# Patient Record
Sex: Female | Born: 1995 | Race: White | Hispanic: Yes | Marital: Single | Smoking: Current some day smoker
Health system: Southern US, Community
[De-identification: ages and names within clinical notes are randomized; demographics above are authoritative.]

## PROBLEM LIST (undated history)

## (undated) ENCOUNTER — Inpatient Hospital Stay: Payer: Self-pay

## (undated) DIAGNOSIS — Z8619 Personal history of other infectious and parasitic diseases: Secondary | ICD-10-CM

## (undated) DIAGNOSIS — J45909 Unspecified asthma, uncomplicated: Secondary | ICD-10-CM

## (undated) DIAGNOSIS — Z789 Other specified health status: Secondary | ICD-10-CM

## (undated) HISTORY — PX: NO PAST SURGERIES: SHX2092

## (undated) HISTORY — DX: Personal history of other infectious and parasitic diseases: Z86.19

## (undated) NOTE — *Deleted (*Deleted)
Preventive Care 21-39 Years Old, Female Preventive care refers to visits with your health care provider and lifestyle choices that can promote health and wellness. This includes:  A yearly physical exam. This may also be called an annual well check.  Regular dental visits and eye exams.  Immunizations.  Screening for certain conditions.  Healthy lifestyle choices, such as eating a healthy diet, getting regular exercise, not using drugs or products that contain nicotine and tobacco, and limiting alcohol use. What can I expect for my preventive care visit? Physical exam Your health care provider will check your:  Height and weight. This may be used to calculate body mass index (BMI), which tells if you are at a healthy weight.  Heart rate and blood pressure.  Skin for abnormal spots. Counseling Your health care provider may ask you questions about your:  Alcohol, tobacco, and drug use.  Emotional well-being.  Home and relationship well-being.  Sexual activity.  Eating habits.  Work and work environment.  Method of birth control.  Menstrual cycle.  Pregnancy history. What immunizations do I need?  Influenza (flu) vaccine  This is recommended every year. Tetanus, diphtheria, and pertussis (Tdap) vaccine  You may need a Td booster every 10 years. Varicella (chickenpox) vaccine  You may need this if you have not been vaccinated. Human papillomavirus (HPV) vaccine  If recommended by your health care provider, you may need three doses over 6 months. Measles, mumps, and rubella (MMR) vaccine  You may need at least one dose of MMR. You may also need a second dose. Meningococcal conjugate (MenACWY) vaccine  One dose is recommended if you are age 19-21 years and a first-year college student living in a residence hall, or if you have one of several medical conditions. You may also need additional booster doses. Pneumococcal conjugate (PCV13) vaccine  You may need  this if you have certain conditions and were not previously vaccinated. Pneumococcal polysaccharide (PPSV23) vaccine  You may need one or two doses if you smoke cigarettes or if you have certain conditions. Hepatitis A vaccine  You may need this if you have certain conditions or if you travel or work in places where you may be exposed to hepatitis A. Hepatitis B vaccine  You may need this if you have certain conditions or if you travel or work in places where you may be exposed to hepatitis B. Haemophilus influenzae type b (Hib) vaccine  You may need this if you have certain conditions. You may receive vaccines as individual doses or as more than one vaccine together in one shot (combination vaccines). Talk with your health care provider about the risks and benefits of combination vaccines. What tests do I need?  Blood tests  Lipid and cholesterol levels. These may be checked every 5 years starting at age 20.  Hepatitis C test.  Hepatitis B test. Screening  Diabetes screening. This is done by checking your blood sugar (glucose) after you have not eaten for a while (fasting).  Sexually transmitted disease (STD) testing.  BRCA-related cancer screening. This may be done if you have a family history of breast, ovarian, tubal, or peritoneal cancers.  Pelvic exam and Pap test. This may be done every 3 years starting at age 21. Starting at age 30, this may be done every 5 years if you have a Pap test in combination with an HPV test. Talk with your health care provider about your test results, treatment options, and if necessary, the need for more tests.   Follow these instructions at home: Eating and drinking   Eat a diet that includes fresh fruits and vegetables, whole grains, lean protein, and low-fat dairy.  Take vitamin and mineral supplements as recommended by your health care provider.  Do not drink alcohol if: ? Your health care provider tells you not to drink. ? You are  pregnant, may be pregnant, or are planning to become pregnant.  If you drink alcohol: ? Limit how much you have to 0-1 drink a day. ? Be aware of how much alcohol is in your drink. In the [place] one drink equals one 12 oz bottle of beer (355 mL), one 5 oz glass of wine (148 mL), or one 1 oz glass of hard liquor (44 mL). Lifestyle  Take daily care of your teeth and gums.  Stay active. Exercise for at least 30 minutes on 5 or more days each week.  Do not use any products that contain nicotine or tobacco, such as cigarettes, e-cigarettes, and chewing tobacco. If you need help quitting, ask your health care provider.  If you are sexually active, practice safe sex. Use a condom or other form of birth control (contraception) in order to prevent pregnancy and STIs (sexually transmitted infections). If you plan to become pregnant, see your health care provider for a preconception visit. What's next?  Visit your health care provider once a year for a well check visit.  Ask your health care provider how often you should have your eyes and teeth checked.  Stay up to date on all vaccines. This information is not intended to replace advice given to you by your health care provider. Make sure you discuss any questions you have with your health care provider. Document Revised: 04/18/2018 Document Reviewed: 04/18/2018 Elsevier Patient Education  2020 [employer] Breast Self-Awareness Breast self-awareness is knowing how your breasts look and feel. Doing breast self-awareness is important. It allows you to catch a breast problem early while it is still small and can be treated. All women should do breast self-awareness, including women who have had breast implants. Tell your doctor if you notice a change in your breasts. What you need:  A mirror.  A well-lit room. How to do a breast self-exam A breast self-exam is one way to learn what is normal for your breasts and to check for changes. To do a  breast self-exam: Look for changes  1. Take off all the clothes above your waist. 2. Stand in front of a mirror in a room with good lighting. 3. Put your hands on your hips. 4. Push your hands down. 5. Look at your breasts and nipples in the mirror to see if one breast or nipple looks different from the other. Check to see if: ? The shape of one breast is different. ? The size of one breast is different. ? There are wrinkles, dips, and bumps in one breast and not the other. 6. Look at each breast for changes in the skin, such as: ? Redness. ? Scaly areas. 7. Look for changes in your nipples, such as: ? Liquid around the nipples. ? Bleeding. ? Dimpling. ? Redness. ? A change in where the nipples are. Feel for changes  1. Lie on your back on the floor. 2. Feel each breast. To do this, follow these steps: ? Pick a breast to feel. ? Put the arm closest to that breast above your head. ? Use your other arm to feel the nipple area of your breast. Feel   the area with the pads of your three middle fingers by making small circles with your fingers. For the first circle, press lightly. For the second circle, press harder. For the third circle, press even harder. ? Keep making circles with your fingers at the different pressures as you move down your breast. Stop when you feel your ribs. ? Move your fingers a little toward the center of your body. ? Start making circles with your fingers again, this time going up until you reach your collarbone. ? Keep making up-and-down circles until you reach your armpit. Remember to keep using the three pressures. ? Feel the other breast in the same way. 3. Sit or stand in the tub or shower. 4. With soapy water on your skin, feel each breast the same way you did in step 2 when you were lying on the floor. Write down what you find Writing down what you find can help you remember what to tell your doctor. Write down:  What is normal for each breast.  Any  changes you find in each breast, including: ? The kind of changes you find. ? Whether you have pain. ? Size and location of any lumps.  When you last had your menstrual period. General tips  Check your breasts every month.  If you are breastfeeding, the best time to check your breasts is after you feed your baby or after you use a breast pump.  If you get menstrual periods, the best time to check your breasts is 5-7 days after your menstrual period is over.  With time, you will become comfortable with the self-exam, and you will begin to know if there are changes in your breasts. Contact a doctor if you:  See a change in the shape or size of your breasts or nipples.  See a change in the skin of your breast or nipples, such as red or scaly skin.  Have fluid coming from your nipples that is not normal.  Find a lump or thick area that was not there before.  Have pain in your breasts.  Have any concerns about your breast health. Summary  Breast self-awareness includes looking for changes in your breasts, as well as feeling for changes within your breasts.  Breast self-awareness should be done in front of a mirror in a well-lit room.  You should check your breasts every month. If you get menstrual periods, the best time to check your breasts is 5-7 days after your menstrual period is over.  Let your doctor know of any changes you see in your breasts, including changes in size, changes on the skin, pain or tenderness, or fluid from your nipples that is not normal. This information is not intended to replace advice given to you by your health care provider. Make sure you discuss any questions you have with your health care provider. Document Revised: 03/26/2018 Document Reviewed: 03/26/2018 Elsevier Patient Education  2020 [employer] Inc.  

---
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
## 2015-04-05 ENCOUNTER — Encounter: Payer: Self-pay | Admitting: Emergency Medicine

## 2015-04-05 ENCOUNTER — Emergency Department: Payer: Medicaid Other

## 2015-04-05 ENCOUNTER — Emergency Department
Admission: EM | Admit: 2015-04-05 | Discharge: 2015-04-05 | Disposition: A | Discharge status: Home / Self Care | Payer: Medicaid Other | Attending: Emergency Medicine | Admitting: Emergency Medicine

## 2015-04-05 DIAGNOSIS — O9989 Other specified diseases and conditions complicating pregnancy, childbirth and the puerperium: Secondary | ICD-10-CM | POA: Insufficient documentation

## 2015-04-05 DIAGNOSIS — M546 Pain in thoracic spine: Secondary | ICD-10-CM | POA: Diagnosis not present

## 2015-04-05 DIAGNOSIS — M545 Low back pain: Secondary | ICD-10-CM | POA: Diagnosis not present

## 2015-04-05 DIAGNOSIS — R1030 Lower abdominal pain, unspecified: Secondary | ICD-10-CM

## 2015-04-05 DIAGNOSIS — R109 Unspecified abdominal pain: Secondary | ICD-10-CM | POA: Diagnosis not present

## 2015-04-05 DIAGNOSIS — Z3A15 15 weeks gestation of pregnancy: Secondary | ICD-10-CM | POA: Diagnosis not present

## 2015-04-05 DIAGNOSIS — M549 Dorsalgia, unspecified: Secondary | ICD-10-CM

## 2015-04-05 DIAGNOSIS — O26899 Other specified pregnancy related conditions, unspecified trimester: Secondary | ICD-10-CM

## 2015-04-05 DIAGNOSIS — O99891 Other specified diseases and conditions complicating pregnancy: Secondary | ICD-10-CM

## 2015-04-05 LAB — COMPREHENSIVE METABOLIC PANEL
ALT: 21 U/L (ref 14–54)
AST: 28 U/L (ref 15–41)
Albumin: 4.3 g/dL (ref 3.5–5.0)
Alkaline Phosphatase: 38 U/L (ref 38–126)
Anion gap: 11 (ref 5–15)
BUN: 9 mg/dL (ref 6–20)
CO2: 20 mmol/L — ABNORMAL LOW (ref 22–32)
Calcium: 9.1 mg/dL (ref 8.9–10.3)
Chloride: 102 mmol/L (ref 101–111)
Creatinine, Ser: 0.66 mg/dL (ref 0.44–1.00)
GFR calc Af Amer: >60 mL/min (ref >60)
GFR calc non Af Amer: >60 mL/min (ref >60)
Glucose, Bld: 112 mg/dL — ABNORMAL HIGH (ref 65–99)
Potassium: 3.3 mmol/L — ABNORMAL LOW (ref 3.5–5.1)
Sodium: 133 mmol/L — ABNORMAL LOW (ref 135–145)
Total Bilirubin: 0.5 mg/dL (ref 0.3–1.2)
Total Protein: 7.6 g/dL (ref 6.5–8.1)

## 2015-04-05 LAB — URINALYSIS COMPLETE WITH MICROSCOPIC (ARMC ONLY)
Bacteria, UA: NONE SEEN
Bilirubin Urine: NEGATIVE
Glucose, UA: NEGATIVE mg/dL
Hgb urine dipstick: NEGATIVE
Leukocytes, UA: NEGATIVE
Nitrite: NEGATIVE
Protein, ur: NEGATIVE mg/dL
Specific Gravity, Urine: 1.03 (ref 1.005–1.030)
pH: 5 (ref 5.0–8.0)

## 2015-04-05 LAB — CBC
HCT: 37.9 % (ref 35.0–47.0)
Hemoglobin: 12.9 g/dL (ref 12.0–16.0)
MCH: 27.4 pg (ref 26.0–34.0)
MCHC: 34.1 g/dL (ref 32.0–36.0)
MCV: 80.2 fL (ref 80.0–100.0)
Platelets: 230 10*3/uL (ref 150–440)
RBC: 4.73 MIL/uL (ref 3.80–5.20)
RDW: 13.2 % (ref 11.5–14.5)
WBC: 10.3 10*3/uL (ref 3.6–11.0)

## 2015-04-05 LAB — LIPASE, BLOOD: Lipase: 21 U/L — ABNORMAL LOW (ref 22–51)

## 2015-04-05 NOTE — Discharge Instructions (Signed)
You've been seen in the emergency department today for back pain and abdominal pain during her pregnancy. Your workup as shown largely normal results. Please follow-up with your OB/GYN physician as soon as possible to discuss her symptoms. Please take Tylenol as needed, as written on the box. Return to the emergency department for any personally concerning symptoms.  Abdominal Pain During Pregnancy Abdominal pain is common in pregnancy. Most of the time, it does not cause harm. There are many causes of abdominal pain. Some causes are more serious than others. Some of the causes of abdominal pain in pregnancy are easily diagnosed. Occasionally, the diagnosis takes time to understand. Other times, the cause is not determined. Abdominal pain can be a sign that something is very wrong with the pregnancy, or the pain may have nothing to do with the pregnancy at all. For this reason, always tell your health care provider if you have any abdominal discomfort. HOME CARE INSTRUCTIONS  Monitor your abdominal pain for any changes. The following actions may help to alleviate any discomfort you are experiencing:  Do not have sexual intercourse or put anything in your vagina until your symptoms go away completely.  Get plenty of rest until your pain improves.  Drink clear fluids if you feel nauseous. Avoid solid food as long as you are uncomfortable or nauseous.  Only take over-the-counter or prescription medicine as directed by your health care provider.  Keep all follow-up appointments with your health care provider. SEEK IMMEDIATE MEDICAL CARE IF:  You are bleeding, leaking fluid, or passing tissue from the vagina.  You have increasing pain or cramping.  You have persistent vomiting.  You have painful or bloody urination.  You have a fever.  You notice a decrease in your baby's movements.  You have extreme weakness or feel faint.  You have shortness of breath, with or without abdominal  pain.  You develop a severe headache with abdominal pain.  You have abnormal vaginal discharge with abdominal pain.  You have persistent diarrhea.  You have abdominal pain that continues even after rest, or gets worse. MAKE SURE YOU:   Understand these instructions.  Will watch your condition.  Will get help right away if you are not doing well or get worse. Document Released: 08/07/2005 Document Revised: 05/28/2013 Document Reviewed: 03/06/2013 [facility] Patient Information 2015 [place], [place]. This information is not intended to replace advice given to you by your health care provider. Make sure you discuss any questions you have with your health care provider.

## 2015-04-05 NOTE — ED Notes (Signed)
Pt has low abd pain since yesterday.  Pt states she is approx [redacted] weeks pregnant.  No vag bleeding   No dysuria.  Pt has had recent uti sx before pregnancy.  Pt reports pain with intercourse and with walking.   Pt's first appt with ob is next week with [facility].

## 2015-04-05 NOTE — ED Notes (Addendum)
Pt reports lower back pain for "weeks", reports painful to walk. Pt approx 15w 1d pregnant. Pt also reports lower abdominal cramping x2 weeks. Pt denies vaginal bleeding or discharge, pt and mother reports painful sex.

## 2015-04-05 NOTE — ED Provider Notes (Signed)
[facility] Emergency Department Provider Note  Time seen: 5:43 PM  I have reviewed the triage vital signs and the nursing notes.   HISTORY  Chief Complaint Abdominal Pain and Back Pain    HPI Sherry Malone is a 19 y.o. female who presents the emergency department with lower abdominal pain and lower back pain for the past 2 weeks. According to the patient she is currently [redacted] weeks pregnant by her last period, has not had an ultrasound, complaining of lower abdominal and lower back pain for the past 2 weeks. Patient states intermittent, but worse with walking, worse with sex, denies dysuria, nausea, vomiting, diarrhea. Denies vaginal bleeding, discharge, fluid. Describes her pain as moderate, dull/aching.    History reviewed. No pertinent past medical history.  There are no active problems to display for this patient.   History reviewed. No pertinent past surgical history.  No current outpatient prescriptions on file.  Allergies Review of patient's allergies indicates no known allergies.  No family history on file.  Social History Social History  Substance Use Topics  . Smoking status: Never Smoker   . Smokeless tobacco: None  . Alcohol Use: No    Review of Systems Constitutional: Negative for fever. Cardiovascular: Negative for chest pain. Respiratory: Negative for shortness of breath. Gastrointestinal: Positive for lower abdominal pain. Genitourinary: Negative for dysuria. Musculoskeletal: Positive for lower back pain. 10-point ROS otherwise negative.  ____________________________________________   PHYSICAL EXAM:  VITAL SIGNS: ED Triage Vitals  Enc Vitals Group     BP 04/05/15 1639 113/66 mmHg     Pulse Rate 04/05/15 1639 104     Resp 04/05/15 1639 16     Temp 04/05/15 1639 98.1 F (36.7 C)     Temp Source 04/05/15 1639 Oral     SpO2 04/05/15 1639 99 %     Weight 04/05/15 1639 156 lb (70.761 kg)     Height 04/05/15 1639  5\' 1"  (1.549 m)     Head Cir --      Peak Flow --      Pain Score 04/05/15 1640 10     Pain Loc --      Pain Edu? --      Excl. in GC? --     Constitutional: Alert and oriented. Well appearing and in no distress. Eyes: Normal exam ENT   Head: Normocephalic and atraumatic. Cardiovascular: Normal rate, regular rhythm. No murmurs, rubs, or gallops. Respiratory: Normal respiratory effort without tachypnea nor retractions. Breath sounds are clear and equal bilaterally. No wheezes/rales/rhonchi. Gastrointestinal: Soft and nontender. No distention.  Mild bilateral CVA tenderness palpation. Musculoskeletal: Nontender with normal range of motion in all extremities. No lower extremity tenderness or edema. Mild thoracic and lumbar paraspinal tenderness palpation. No midline tenderness. Neurologic:  Normal speech and language. No gross focal neurologic deficits are appreciated. Speech is normal. Skin:  Skin is warm, dry and intact.  Psychiatric: Mood and affect are normal. Speech and behavior are normal. Patient exhibits appropriate insight and judgment.  ____________________________________________   RADIOLOGY  Ultrasound within normal limits for 11 week 2 day fetus.  ____________________________________________   INITIAL IMPRESSION / ASSESSMENT AND PLAN / ED COURSE  Pertinent labs & imaging results that were available during my care of the patient were reviewed by me and considered in my medical decision making (see chart for details).  Patient with 2 weeks of intermittent lower back pain and lower abdominal pain, worse with ambulation/movement. Patient has not yet seen an  OB ((appointment is next week). Overall patient appears very well. Mild back tenderness on exam, no abdominal tenderness elicited. No rebound or guarding. No distention. We will check labs, ultrasound, and monitor in the emergency department.  Labs are largely within normal limits. Ultrasound within normal limits.  We'll discharge patient home with Tylenol as needed, an OB/GYN follow-up.  ____________________________________________   FINAL CLINICAL IMPRESSION(S) / ED DIAGNOSES  Lower abdominal pain Back pain   Minna Antis, MD 04/05/15 2005

## 2015-06-10 ENCOUNTER — Ambulatory Visit: Payer: Medicaid Other

## 2015-06-10 ENCOUNTER — Ambulatory Visit
Admission: RE | Admit: 2015-06-10 | Discharge: 2015-06-10 | Disposition: A | Discharge status: Home / Self Care | Payer: Medicaid Other | Source: Ambulatory Visit | Attending: Maternal & Fetal Medicine | Admitting: Maternal & Fetal Medicine

## 2015-06-10 VITALS — BP 114/60 | HR 95 | Temp 98.2°F | Resp 18 | Ht 62.4 in | Wt 152.8 lb

## 2015-06-10 DIAGNOSIS — Z83518 Family history of other specified eye disorder: Secondary | ICD-10-CM | POA: Insufficient documentation

## 2015-06-10 DIAGNOSIS — IMO0002 Reserved for concepts with insufficient information to code with codable children: Secondary | ICD-10-CM | POA: Insufficient documentation

## 2015-06-10 DIAGNOSIS — O351XX0 Maternal care for (suspected) chromosomal abnormality in fetus, not applicable or unspecified: Secondary | ICD-10-CM | POA: Diagnosis not present

## 2015-06-10 DIAGNOSIS — Z31438 Encounter for other genetic testing of female for procreative management: Secondary | ICD-10-CM | POA: Insufficient documentation

## 2015-06-10 HISTORY — DX: Other specified health status: Z78.9

## 2015-06-10 NOTE — Progress Notes (Signed)
Genetic counseling recommendations and assessment reviewed with CGC Wells, I agree with her consultation as outlined.  

## 2015-06-10 NOTE — Progress Notes (Addendum)
Referring physician:  [facility] 40 minute consultation  [name] was seen for genetic counseling at [facility] [place] on 06/09/2105 to discuss the finding on increased nuchal fold on prior ultrasound.  She was seen today for counseling and lab studies, but will return to this clinic on Thursday, 06/17/2015 for a detailed anatomy ultrasound, as that was not initially ordered for today.    We reviewed the finding of the nuchal fold measuring 6.14 on ultrasound at [facility] at 19 weeks.  The remainder of the anatomy was reported to be normal.  We do recommend a detailed anatomy ultrasound in our clinic for fully assess the risks in this pregnancy.  In general, we discussed that an increased nuchal fold, or nuchal thickening, is sometimes seen in babies with chromosome problems, such as Down syndrome.  It can also be seen in babies without chromosome problems as a normal variation.  Because this finding increased our concern about chromosome conditions, we talked about the options for looking more closely at this pregnancy.  Before nuchal thickening was seen on the ultrasound, [name] had a 1 in >1,000 chance of having a baby with Down syndrome based on her age.  Now that this has been seen, the risk is increased.  There is some debate as to how much nuchal thickening increases the chance for a chromosome problem, particularly when there are no other birth defects seen on ultrasound.  The chance for Down syndrome may be between 7 and 30%.  Regardless of the risk estimate, the most likely chance is that the baby does not have Down syndrome.  We reviewed that Down syndrome is a condition caused by an extra chromosome.  Chromosomes are the structures inside each of the cells of our body which contain the genetic information that directs the growth and development of our bodies.  Each child inherits 23 chromosomes from the mother and 33 from the father.  If an  extra chromosome is passed on, then the baby will have health concerns, birth defects and learning differences as a result.  If the extra chromosome is the number 21 chromosome, then this results in features consistent with Down syndrome including characteristic facial differences, developmental delays and certain birth defects.  At the time of the visit, we reviewed the following testing and screening options for this pregnancy:  Circulating cell free fetal DNA testing (also called the Harmony test) may be used to determine whether or not the baby may have either Down syndrome, trisomy 80, or trisomy 44. This test utilizes a maternal blood sample and DNA sequencing technology to isolate circulating cell free fetal DNA from maternal plasma. The fetal DNA can then be analyzed for DNA sequences that are derived from the three most common chromosomes involved in aneuploidy, chromosomes 13, 18, and 21. If the overall amount of DNA is greater than the expected level for any of these chromosomes, aneuploidy is suspected. This testing is commercially available, and is able to provide another means of determining the chance for one of these common chromosome conditions, without requiring an invasive procedure and traditional karyotype analysis. We discussed this option in detail. We explained that while we do not consider it a replacement for invasive testing and karyotype analysis, a negative result from this testing would be reassuring, though not a guarantee of a normal chromosome complement for the baby. An abnormal result is certainly suggestive of an abnormal chromosome complement, though we would still recommend CVS  or amniocentesis to confirm any findings from this testing.   Targeted ultrasound uses high frequency sound waves to create an image of the developing fetus.  An ultrasound is often recommended as a routine means of evaluating the pregnancy.  It is also used to screen for fetal anatomy problems (for  example, a heart defect) that might be suggestive of a chromosomal or other abnormality.   Amniocentesis involves the removal of a small amount of amniotic fluid from the sac surrounding the fetus with the use of a thin needle inserted through the maternal abdomen and uterus.  Ultrasound guidance is used throughout the procedure.  Fetal cells from amniotic fluid are directly evaluated and > 99.5% of chromosome problems and > 98% of open neural tube defects can be detected. This procedure is generally performed after the 15th week of pregnancy.  The main risks to this procedure include complications leading to miscarriage in less than 1 in 200 cases (0.5%).  At this gestational age, amniocentesis would be performed on labor and delivery at [facility].  Fetal Echocardiogram is a specialized ultrasound of the fetal heart, typically performed after [redacted] weeks gestation.  This option could be considered because babies with a chromosome condition are known to have a increased chance for having a structural heart defect.  Fetal echocardiograms are performed in pediatric cardiology and can be scheduled by your doctor.  After consideration of these options, [name] elected to proceed with InformaSeq testing and MSAFP only today and scheduled her detailed anatomy ultrasound for Thursday, October 27 at 3pm at [facility] [place].  We also inquired about the family and pregnancy history.  [name] reported that her mother has been diagnosed with retinitis pigmentosa (RP).  RP is the term used to describe a group of inherited eye disorders.  These conditions result in night blindness with slowly progressing loss of central vision.  There are mulitple genes that are known to cause RP and can be inherited in various ways, including recessive, dominant and X-linked forms. We encouraged the patient to find out more information from her mother so that we can dicsus this  further next week.  In addition, she reported two cousins with health concerns - one with a heart defect and one with heart problems as well as physical impairments resulting in being in a wheelchair.  The father of the baby, Madelaine Bhat, has scoliosis and a cousin with muscle problems in his legs.  We reviewed that there may be many causes for muscle weakness, including some which are inherited, but that without additional information about the diagnosis it is difficult to assess the chances for other family members to have a similar condition.  Scoliosis is likely the result of a combination of genetic and other factors.  Children of persons with scoliosis are thought to have approximately a 5% chance for also having the condition.  Congenital heart defects are among the most common birth defects, occuring in approximately 1 in 200 livebirths.  They may occur as an isolated condition or in combination with other differences in numerous genetic syndromes.  In the absence of a known genetic syndrome, most congenital heart defects are multifactorial, or caused by a combination of genetic and environmental factors.  Given the distance of this relative to the current pregnancy (a 4th degree relative), the chance for a heart defect in this pregnancy is likely not significantly increase above the population risk.  However, if more information is learned  or a specific genetic syndrome is identified as the cause, we are happy to discuss this information further.  There is no other family history of birth defects, mental retardation or known genetic conditions.  There were also no exposures or complications in the pregnancy that are expected to increase the risk for birth defects in this pregnancy.  The patient was encouraged to contact us with any questions.  We may be reached at [phone].   Cherly Andersoneborah F. Diondra Pines, MS, CGC

## 2015-06-14 LAB — INFORMASEQ(SM) WITH XY ANALYSIS
Fetal Fraction (%):: 14.4
Fetal Number: 1
Gestational Age at Collection: 20.7 weeks
Weight: 152 [lb_av]

## 2015-06-17 ENCOUNTER — Ambulatory Visit
Admission: RE | Admit: 2015-06-17 | Discharge: 2015-06-17 | Disposition: A | Discharge status: Home / Self Care | Payer: Medicaid Other | Source: Ambulatory Visit | Attending: Maternal & Fetal Medicine | Admitting: Maternal & Fetal Medicine

## 2015-06-17 ENCOUNTER — Other Ambulatory Visit: Payer: Self-pay

## 2015-06-17 DIAGNOSIS — O359XX0 Maternal care for (suspected) fetal abnormality and damage, unspecified, not applicable or unspecified: Secondary | ICD-10-CM | POA: Diagnosis present

## 2015-06-17 DIAGNOSIS — Z3A21 21 weeks gestation of pregnancy: Secondary | ICD-10-CM | POA: Diagnosis not present

## 2015-06-17 LAB — MISC LABCORP TEST (SEND OUT): Labcorp test code: 10801

## 2015-06-17 NOTE — Progress Notes (Signed)
While here for her anatomy ultrasound today, the patient was informed of the results of her recent InformaSeq testing (performed at [facility]) which yielded NEGATIVE results.  The patient's specimen showed DNA consistent with two copies of chromosomes 21, 18 and 13.  The sensitivity for trisomy 7621, trisomy 7218 and trisomy 5213 using this testing are reported as 99.1%, 98.3% and 98.1% respectively.  Thus, while the results of this testing are highly accurate, they are not considered diagnostic at this time.  Should more definitive information be desired, the patient may still consider amniocentesis.   As requested to know by the patient, sex chromosome analysis was included for this sample.  Results was consistent with a female fetus. This is predicted with >97% accuracy.  A maternal serum AFP only was also ordered at her previous visit to assess for neural tube defects.  These results were also within normal limits.

## 2015-07-30 LAB — OB RESULTS CONSOLE RPR: RPR: NONREACTIVE

## 2015-07-30 LAB — OB RESULTS CONSOLE PLATELET COUNT: Platelets: 204 10*3/uL

## 2015-07-30 LAB — OB RESULTS CONSOLE HGB/HCT, BLOOD
HCT: 34 %
Hemoglobin: 11.4 g/dL

## 2015-07-30 LAB — OB RESULTS CONSOLE GC/CHLAMYDIA
Chlamydia: NEGATIVE
Gonorrhea: NEGATIVE

## 2015-07-30 LAB — OB RESULTS CONSOLE GBS: GBS: POSITIVE

## 2015-07-30 LAB — OB RESULTS CONSOLE HIV ANTIBODY (ROUTINE TESTING): HIV: NONREACTIVE

## 2015-08-22 NOTE — L&D Delivery Note (Addendum)
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  Delivered By:   Cornelia Copaharlie Wendie Diskin, Jr MD  Delivery Type:   forceps, low  Delivery Details:   Went to see patient due to prolonged and decel decelerations with pushing. Fetus LOA, +3, minimal caput and adequate pelvis. Peds present, anesthesia re-bolused epidural and bladder recently drained. Luikart Elliott's applied w/o difficult and with one contraction and push, fetus delivered easily; forceps removed prior to complete delivery. Delayed cord clamping and cut by father of the baby.  Anesthesia:    epidural Intrapartum complications: gestational HTN GBS:    Positive and s/p of at least two doses of PCN Laceration:    1st degree right vaginal sulcal (not deep, approximately 4cm and perineal divet at introitus). Perineum intact. Repaired with 2-0 vicryl. Rectal exam confirmatory Episiotomy:    none Placenta:    Delivered and expressed via active management. Intact: yes. To pathology: no.  Cord was short Estimated Blood Loss:  300mL  Baby:    Liveborn female, APGARs 10/9, weight 2840gm  Cornelia Copaharlie Amias Hutchinson, Jr. MD [facility] Pager [phone]

## 2015-10-25 ENCOUNTER — Inpatient Hospital Stay
Admission: EM | Admit: 2015-10-25 | Discharge: 2015-10-28 | DRG: 775 | Disposition: A | Discharge status: Home / Self Care | Payer: Medicaid Other | Attending: Obstetrics and Gynecology | Admitting: Obstetrics and Gynecology

## 2015-10-25 DIAGNOSIS — O480 Post-term pregnancy: Secondary | ICD-10-CM | POA: Diagnosis present

## 2015-10-25 DIAGNOSIS — J45909 Unspecified asthma, uncomplicated: Secondary | ICD-10-CM | POA: Diagnosis present

## 2015-10-25 DIAGNOSIS — Z23 Encounter for immunization: Secondary | ICD-10-CM | POA: Diagnosis not present

## 2015-10-25 DIAGNOSIS — Z87891 Personal history of nicotine dependence: Secondary | ICD-10-CM

## 2015-10-25 DIAGNOSIS — G8929 Other chronic pain: Secondary | ICD-10-CM | POA: Diagnosis present

## 2015-10-25 DIAGNOSIS — O134 Gestational [pregnancy-induced] hypertension without significant proteinuria, complicating childbirth: Secondary | ICD-10-CM | POA: Diagnosis present

## 2015-10-25 DIAGNOSIS — O9081 Anemia of the puerperium: Secondary | ICD-10-CM | POA: Diagnosis not present

## 2015-10-25 DIAGNOSIS — Z3A40 40 weeks gestation of pregnancy: Secondary | ICD-10-CM

## 2015-10-25 DIAGNOSIS — O9952 Diseases of the respiratory system complicating childbirth: Secondary | ICD-10-CM | POA: Diagnosis present

## 2015-10-25 DIAGNOSIS — M545 Low back pain: Secondary | ICD-10-CM | POA: Diagnosis present

## 2015-10-25 DIAGNOSIS — O139 Gestational [pregnancy-induced] hypertension without significant proteinuria, unspecified trimester: Secondary | ICD-10-CM | POA: Diagnosis present

## 2015-10-25 DIAGNOSIS — D649 Anemia, unspecified: Secondary | ICD-10-CM | POA: Diagnosis not present

## 2015-10-25 DIAGNOSIS — O133 Gestational [pregnancy-induced] hypertension without significant proteinuria, third trimester: Secondary | ICD-10-CM | POA: Diagnosis present

## 2015-10-25 DIAGNOSIS — O99824 Streptococcus B carrier state complicating childbirth: Secondary | ICD-10-CM | POA: Diagnosis present

## 2015-10-25 DIAGNOSIS — O0933 Supervision of pregnancy with insufficient antenatal care, third trimester: Secondary | ICD-10-CM | POA: Diagnosis not present

## 2015-10-25 HISTORY — DX: Unspecified asthma, uncomplicated: J45.909

## 2015-10-25 LAB — CBC
HCT: 32.7 % — ABNORMAL LOW (ref 35.0–47.0)
Hemoglobin: 11.3 g/dL — ABNORMAL LOW (ref 12.0–16.0)
MCH: 27.8 pg (ref 26.0–34.0)
MCHC: 34.5 g/dL (ref 32.0–36.0)
MCV: 80.6 fL (ref 80.0–100.0)
Platelets: 154 10*3/uL (ref 150–440)
RBC: 4.06 MIL/uL (ref 3.80–5.20)
RDW: 13.8 % (ref 11.5–14.5)
WBC: 7.7 10*3/uL (ref 3.6–11.0)

## 2015-10-25 LAB — COMPREHENSIVE METABOLIC PANEL
ALT: 7 U/L — ABNORMAL LOW (ref 14–54)
AST: 16 U/L (ref 15–41)
Albumin: 3.2 g/dL — ABNORMAL LOW (ref 3.5–5.0)
Alkaline Phosphatase: 184 U/L — ABNORMAL HIGH (ref 38–126)
Anion gap: 9 (ref 5–15)
BUN: 9 mg/dL (ref 6–20)
CO2: 20 mmol/L — ABNORMAL LOW (ref 22–32)
Calcium: 8.6 mg/dL — ABNORMAL LOW (ref 8.9–10.3)
Chloride: 109 mmol/L (ref 101–111)
Creatinine, Ser: 0.62 mg/dL (ref 0.44–1.00)
GFR calc Af Amer: >60 mL/min (ref >60)
GFR calc non Af Amer: >60 mL/min (ref >60)
Glucose, Bld: 101 mg/dL — ABNORMAL HIGH (ref 65–99)
Potassium: 3.9 mmol/L (ref 3.5–5.1)
Sodium: 138 mmol/L (ref 135–145)
Total Bilirubin: 0.5 mg/dL (ref 0.3–1.2)
Total Protein: 6.5 g/dL (ref 6.5–8.1)

## 2015-10-25 LAB — ABO/RH: ABO/RH(D): A POS

## 2015-10-25 LAB — LACTATE DEHYDROGENASE: LDH: 134 U/L (ref 98–192)

## 2015-10-25 LAB — TYPE AND SCREEN
ABO/RH(D): A POS
Antibody Screen: NEGATIVE

## 2015-10-25 LAB — PROTEIN / CREATININE RATIO, URINE
Creatinine, Urine: 150 mg/dL
Protein Creatinine Ratio: 0.13 mg/mg{Cre} (ref 0.00–0.15)
Total Protein, Urine: 19 mg/dL

## 2015-10-25 LAB — URIC ACID: Uric Acid, Serum: 4.1 mg/dL (ref 2.3–6.6)

## 2015-10-25 MED ORDER — OXYTOCIN 40 UNITS IN LACTATED RINGERS INFUSION - SIMPLE MED
2.5000 [IU]/h | INTRAVENOUS | Status: DC
  Administered 2015-10-26: 2.5 [IU]/h via INTRAVENOUS
  Filled 2015-10-25: qty 1000

## 2015-10-25 MED ORDER — CITRIC ACID-SODIUM CITRATE 334-500 MG/5ML PO SOLN: 30.0000 mL | ORAL | Status: DC | PRN

## 2015-10-25 MED ORDER — PENICILLIN G POTASSIUM 5000000 UNITS IJ SOLR
5.0000 10*6.[IU] | Freq: Once | INTRAVENOUS | Status: AC
  Administered 2015-10-25: 5 10*6.[IU] via INTRAVENOUS
  Filled 2015-10-25: qty 5

## 2015-10-25 MED ORDER — ZOLPIDEM TARTRATE 5 MG PO TABS
ORAL_TABLET | ORAL | Status: AC
  Administered 2015-10-25: 5 mg via ORAL
  Filled 2015-10-25: qty 1

## 2015-10-25 MED ORDER — TERBUTALINE SULFATE 1 MG/ML IJ SOLN: 0.2500 mg | Freq: Once | INTRAMUSCULAR | Status: DC | PRN

## 2015-10-25 MED ORDER — DEXTROSE 5 % IV SOLN
2.5000 10*6.[IU] | INTRAVENOUS | Status: DC
  Administered 2015-10-26 (×4): 2.5 10*6.[IU] via INTRAVENOUS
  Filled 2015-10-25 (×11): qty 2.5

## 2015-10-25 MED ORDER — LACTATED RINGERS IV SOLN: 500.0000 mL | INTRAVENOUS | Status: DC | PRN

## 2015-10-25 MED ORDER — LACTATED RINGERS IV SOLN
INTRAVENOUS | Status: DC
  Administered 2015-10-25: 20:00:00 via INTRAVENOUS

## 2015-10-25 MED ORDER — ZOLPIDEM TARTRATE 5 MG PO TABS
5.0000 mg | ORAL_TABLET | Freq: Every evening | ORAL | Status: DC | PRN
  Administered 2015-10-25: 5 mg via ORAL

## 2015-10-25 MED ORDER — OXYTOCIN BOLUS FROM INFUSION
500.0000 mL | INTRAVENOUS | Status: DC
  Administered 2015-10-26: 500 mL via INTRAVENOUS

## 2015-10-25 MED ORDER — LIDOCAINE HCL (PF) 1 % IJ SOLN: 30.0000 mL | INTRAMUSCULAR | Status: DC | PRN

## 2015-10-25 MED ORDER — DINOPROSTONE 10 MG VA INST
10.0000 mg | VAGINAL_INSERT | Freq: Once | VAGINAL | Status: AC
  Administered 2015-10-25: 10 mg via VAGINAL
  Filled 2015-10-25: qty 1

## 2015-10-25 MED ORDER — ONDANSETRON HCL 4 MG/2ML IJ SOLN
4.0000 mg | Freq: Four times a day (QID) | INTRAMUSCULAR | Status: DC | PRN
Start: 2015-10-25 — End: 2015-10-26
  Administered 2015-10-26: 4 mg via INTRAVENOUS
  Filled 2015-10-25: qty 2

## 2015-10-25 MED ORDER — ACETAMINOPHEN 325 MG PO TABS: 650.0000 mg | ORAL_TABLET | ORAL | Status: DC | PRN

## 2015-10-25 NOTE — H&P (Signed)
Obstetric H&P   Chief Complaint: Elevated blood pressure  Prenatal Care Provider: [facility]  History of Present Illness: 20 y.o. G1P0000 [redacted]w[redacted]d by 10/23/2015, by Ultrasound presenting to L&D for elevated BP readings in clinic.  No HA, vision changes, RUQ or epigastric pain, increased edema.  +FM, no LOF, no VB, no ctx.  PNC noteable for late entry to care, THC+ UDS and EtOH use in 1st trimester.  Increased nuchal translucency at 6mm with normal informaseq and anatomy scan.    ABO, Rh: A pos Antibody:  negative Rubella: None Immune Varicella: Immune RPR: Nonreactive (12/09 0741)  HBsAg: Negative HIV: Non-reactive (12/09 0741)  RPR: Nonreactive (12/09 0741) 1-hr: 123 GBS: Positive (12/09 0741)   TDAP 08/15/16 Review of Systems: 10 point review of systems negative unless otherwise noted in HPI  Past Medical History: Past Medical History  Diagnosis Date  . Medical history non-contributory   . Asthma     Past Surgical History: Past Surgical History  Procedure Laterality Date  . No past surgeries      Past Obstetric History:  Past Gynecologic History:  Family History: History reviewed. No pertinent family history.  Social History: Social History   Social History  . Marital Status: Single    Spouse Name: N/A  . Number of Children: N/A  . Years of Education: N/A   Occupational History  . Not on file.   Social History Main Topics  . Smoking status: Former Smoker -- 0.25 packs/day    Quit date: 03/05/2015  . Smokeless tobacco: Never Used  . Alcohol Use: No     Comment: Last use July 2016  . Drug Use: 9.00 per week    Special: Marijuana, Other-see comments  . Sexual Activity: Yes   Other Topics Concern  . Not on file   Social History Narrative    Medications: Prior to Admission medications   Medication Sig Start Date End Date Taking? Authorizing Provider  Prenatal Vit-Fe Fumarate-FA (PRENATAL MULTIVITAMIN) TABS tablet Take 1 tablet by mouth daily at 12 noon.     Historical Provider, MD    Allergies: No Known Allergies  Physical Exam: Vitals: Blood pressure 151/94, pulse 100, temperature 98.1 F (36.7 C), temperature source Oral, resp. rate 18, height  (1.575 m), last menstrual period 12/20/2014.  FHT: 150, moderate, positive accels, no decels Toco: none  General: NAD HEENT: normocephalic, anicteric Pulmonary: CTAB Cardiovascular: RRR Abdomen: Gravid,  Non-tender Leopolds: vtx Genitourinary: pending Extremities: DTR 2+, no edema  Labs: Results for orders placed or performed during the hospital encounter of 10/25/15 (from the past 24 hour(s))  Protein / creatinine ratio, urine     Status: None   Collection Time: 10/25/15  4:31 PM  Result Value Ref Range   Creatinine, Urine 150 mg/dL   Total Protein, Urine 19 mg/dL   Protein Creatinine Ratio 0.13 0.00 - 0.15 mg/mg[Cre]  CBC     Status: Abnormal   Collection Time: 10/25/15  5:52 PM  Result Value Ref Range   WBC 7.7 3.6 - 11.0 K/uL   RBC 4.06 3.80 - 5.20 MIL/uL   Hemoglobin 11.3 (L) 12.0 - 16.0 g/dL   HCT 96.0 (L) 45.4 - 09.8 %   MCV 80.6 80.0 - 100.0 fL   MCH 27.8 26.0 - 34.0 pg   MCHC 34.5 32.0 - 36.0 g/dL   RDW 11.9 14.7 - 82.9 %   Platelets 154 150 - 440 K/uL  Comprehensive metabolic panel     Status: Abnormal   Collection  Time: 10/25/15  5:52 PM  Result Value Ref Range   Sodium 138 135 - 145 mmol/L   Potassium 3.9 3.5 - 5.1 mmol/L   Chloride 109 101 - 111 mmol/L   CO2 20 (L) 22 - 32 mmol/L   Glucose, Bld 101 (H) 65 - 99 mg/dL   BUN 9 6 - 20 mg/dL   Creatinine, Ser 0.980.62 0.44 - 1.00 mg/dL   Calcium 8.6 (L) 8.9 - 10.3 mg/dL   Total Protein 6.5 6.5 - 8.1 g/dL   Albumin 3.2 (L) 3.5 - 5.0 g/dL   AST 16 15 - 41 U/L   ALT 7 (L) 14 - 54 U/L   Alkaline Phosphatase 184 (H) 38 - 126 U/L   Total Bilirubin 0.5 0.3 - 1.2 mg/dL   GFR calc non Af Amer >60 >60 mL/min   GFR calc Af Amer >60 >60 mL/min   Anion gap 9 5 - 15  Uric acid     Status: None   Collection Time:  10/25/15  5:52 PM  Result Value Ref Range   Uric Acid, Serum 4.1 2.3 - 6.6 mg/dL  Lactate dehydrogenase     Status: None   Collection Time: 10/25/15  5:52 PM  Result Value Ref Range   LDH 134 98 - 192 U/L  Type and screen [facility]     Status: None (Preliminary result)   Collection Time: 10/25/15  5:52 PM  Result Value Ref Range   ABO/RH(D) PENDING    Antibody Screen PENDING    Sample Expiration 10/28/2015     Assessment: 20 y.o. G1P0000 2380w2d by 10/23/2015, with GHTN  Plan: 1) GHTN - negative preeclampsia labs, at term keep for induction - cervidil  2) Fetus - cat I tracing  3) TDAP - up to date  4) Disposition - pending delivery

## 2015-10-26 ENCOUNTER — Inpatient Hospital Stay: Payer: Medicaid Other | Admitting: Anesthesiology

## 2015-10-26 ENCOUNTER — Encounter: Payer: Self-pay | Admitting: Obstetrics and Gynecology

## 2015-10-26 LAB — URINE DRUG SCREEN, QUALITATIVE (ARMC ONLY)
Amphetamines, Ur Screen: NOT DETECTED
Barbiturates, Ur Screen: NOT DETECTED
Benzodiazepine, Ur Scrn: NOT DETECTED
Cannabinoid 50 Ng, Ur ~~LOC~~: NOT DETECTED
Cocaine Metabolite,Ur ~~LOC~~: NOT DETECTED
MDMA (Ecstasy)Ur Screen: NOT DETECTED
Methadone Scn, Ur: NOT DETECTED
Opiate, Ur Screen: NOT DETECTED
Phencyclidine (PCP) Ur S: NOT DETECTED
Tricyclic, Ur Screen: NOT DETECTED

## 2015-10-26 MED ORDER — BUTORPHANOL TARTRATE 1 MG/ML IJ SOLN
INTRAMUSCULAR | Status: AC
  Administered 2015-10-26: 2 mg via INTRAVENOUS
  Filled 2015-10-26: qty 2

## 2015-10-26 MED ORDER — SODIUM CHLORIDE FLUSH 0.9 % IV SOLN
INTRAVENOUS | Status: AC
  Filled 2015-10-26: qty 10

## 2015-10-26 MED ORDER — NALBUPHINE HCL 10 MG/ML IJ SOLN: 5.0000 mg | INTRAMUSCULAR | Status: DC | PRN

## 2015-10-26 MED ORDER — BUPIVACAINE HCL (PF) 0.25 % IJ SOLN
INTRAMUSCULAR | Status: DC | PRN
  Administered 2015-10-26: 4 mL via EPIDURAL
  Administered 2015-10-26: 8 mL via EPIDURAL
  Administered 2015-10-26: 4 mL via EPIDURAL

## 2015-10-26 MED ORDER — ACETAMINOPHEN 325 MG PO TABS: 650.0000 mg | ORAL_TABLET | ORAL | Status: DC | PRN

## 2015-10-26 MED ORDER — BENZOCAINE-MENTHOL 20-0.5 % EX AERO
1.0000 "application " | INHALATION_SPRAY | CUTANEOUS | Status: DC | PRN
  Administered 2015-10-26: 1 via TOPICAL
  Filled 2015-10-26: qty 56

## 2015-10-26 MED ORDER — NALOXONE HCL 2 MG/2ML IJ SOSY
1.0000 ug/kg/h | PREFILLED_SYRINGE | INTRAVENOUS | Status: DC | PRN
  Filled 2015-10-26: qty 2

## 2015-10-26 MED ORDER — POLYETHYLENE GLYCOL 3350 17 G PO PACK
17.0000 g | PACK | Freq: Every day | ORAL | Status: DC
  Administered 2015-10-27 – 2015-10-28 (×2): 17 g via ORAL
  Filled 2015-10-26 (×2): qty 1

## 2015-10-26 MED ORDER — LIDOCAINE HCL (PF) 1 % IJ SOLN
INTRAMUSCULAR | Status: DC | PRN
  Administered 2015-10-26 (×2): 3 mL
  Administered 2015-10-26: 2 mL

## 2015-10-26 MED ORDER — IBUPROFEN 600 MG PO TABS
600.0000 mg | ORAL_TABLET | Freq: Four times a day (QID) | ORAL | Status: DC | PRN
  Administered 2015-10-26 – 2015-10-28 (×6): 600 mg via ORAL
  Filled 2015-10-26 (×6): qty 1

## 2015-10-26 MED ORDER — DIBUCAINE 1 % RE OINT: 1.0000 "application " | TOPICAL_OINTMENT | RECTAL | Status: DC | PRN

## 2015-10-26 MED ORDER — OXYCODONE HCL 5 MG PO TABS
10.0000 mg | ORAL_TABLET | Freq: Four times a day (QID) | ORAL | Status: DC | PRN
  Administered 2015-10-27 – 2015-10-28 (×3): 10 mg via ORAL
  Filled 2015-10-26 (×3): qty 2

## 2015-10-26 MED ORDER — DIPHENHYDRAMINE HCL 50 MG/ML IJ SOLN
12.5000 mg | INTRAMUSCULAR | Status: DC | PRN
Start: 2015-10-26 — End: 2015-10-27

## 2015-10-26 MED ORDER — SIMETHICONE 80 MG PO CHEW: 80.0000 mg | CHEWABLE_TABLET | ORAL | Status: DC | PRN

## 2015-10-26 MED ORDER — NALBUPHINE HCL 10 MG/ML IJ SOLN: 5.0000 mg | Freq: Once | INTRAMUSCULAR | Status: DC | PRN

## 2015-10-26 MED ORDER — DOCUSATE SODIUM 100 MG PO CAPS: 100.0000 mg | ORAL_CAPSULE | Freq: Two times a day (BID) | ORAL | Status: DC | PRN

## 2015-10-26 MED ORDER — FENTANYL 2.5 MCG/ML W/ROPIVACAINE 0.2% IN NS 100 ML EPIDURAL INFUSION (ARMC-ANES)
EPIDURAL | Status: AC
  Administered 2015-10-26: 250 ug via EPIDURAL
  Filled 2015-10-26: qty 100

## 2015-10-26 MED ORDER — BUTORPHANOL TARTRATE 1 MG/ML IJ SOLN
2.0000 mg | INTRAMUSCULAR | Status: DC | PRN
  Administered 2015-10-26 (×3): 2 mg via INTRAVENOUS
  Filled 2015-10-26 (×2): qty 2

## 2015-10-26 MED ORDER — WITCH HAZEL-GLYCERIN EX PADS: 1.0000 "application " | MEDICATED_PAD | CUTANEOUS | Status: DC | PRN

## 2015-10-26 MED ORDER — OXYCODONE HCL 5 MG PO TABS
5.0000 mg | ORAL_TABLET | Freq: Four times a day (QID) | ORAL | Status: DC | PRN
  Administered 2015-10-26 – 2015-10-27 (×2): 5 mg via ORAL
  Filled 2015-10-26 (×2): qty 1

## 2015-10-26 MED ORDER — FENTANYL 2.5 MCG/ML W/ROPIVACAINE 0.2% IN NS 100 ML EPIDURAL INFUSION (ARMC-ANES)
10.0000 mL/h | EPIDURAL | Status: DC
  Administered 2015-10-26: 10 mL/h via EPIDURAL
  Administered 2015-10-26: 250 ug via EPIDURAL

## 2015-10-26 MED ORDER — LIDOCAINE-EPINEPHRINE (PF) 1.5 %-1:200000 IJ SOLN
INTRAMUSCULAR | Status: DC | PRN
  Administered 2015-10-26: 3 mL via PERINEURAL

## 2015-10-26 MED ORDER — NALOXONE HCL 0.4 MG/ML IJ SOLN: 0.4000 mg | INTRAMUSCULAR | Status: DC | PRN

## 2015-10-26 MED ORDER — MEASLES, MUMPS & RUBELLA VAC ~~LOC~~ INJ
0.5000 mL | INJECTION | Freq: Once | SUBCUTANEOUS | Status: AC
  Administered 2015-10-28: 0.5 mL via SUBCUTANEOUS
  Filled 2015-10-26 (×2): qty 0.5

## 2015-10-26 MED ORDER — LANOLIN HYDROUS EX OINT: TOPICAL_OINTMENT | CUTANEOUS | Status: DC | PRN

## 2015-10-26 MED ORDER — OXYTOCIN 40 UNITS IN LACTATED RINGERS INFUSION - SIMPLE MED
2.5000 [IU]/h | INTRAVENOUS | Status: DC | PRN
  Administered 2015-10-27: 2.5 [IU]/h via INTRAVENOUS
  Filled 2015-10-26 (×2): qty 1000

## 2015-10-26 MED ORDER — PRENATAL MULTIVITAMIN CH
1.0000 | ORAL_TABLET | Freq: Every day | ORAL | Status: DC
  Administered 2015-10-27 – 2015-10-28 (×2): 1 via ORAL
  Filled 2015-10-26 (×2): qty 1

## 2015-10-26 MED ORDER — SODIUM CHLORIDE 0.9% FLUSH: 3.0000 mL | INTRAVENOUS | Status: DC | PRN

## 2015-10-26 MED ORDER — DIPHENHYDRAMINE HCL 25 MG PO CAPS: 25.0000 mg | ORAL_CAPSULE | ORAL | Status: DC | PRN

## 2015-10-26 MED ORDER — SENNOSIDES-DOCUSATE SODIUM 8.6-50 MG PO TABS: 1.0000 | ORAL_TABLET | Freq: Every evening | ORAL | Status: DC | PRN

## 2015-10-26 MED ORDER — ONDANSETRON HCL 4 MG/2ML IJ SOLN: 4.0000 mg | Freq: Three times a day (TID) | INTRAMUSCULAR | Status: DC | PRN

## 2015-10-26 NOTE — Anesthesia Preprocedure Evaluation (Signed)
Anesthesia Evaluation  Patient identified by MRN, date of birth, ID band Patient awake    Reviewed: Allergy & Precautions, H&P , NPO status   History of Anesthesia Complications Negative for: history of anesthetic complications  Airway Mallampati: II  TM Distance: >3 FB     Dental  (+) Teeth Intact   Pulmonary asthma , former smoker,    Pulmonary exam normal        Cardiovascular hypertension, Normal cardiovascular exam Rhythm:regular Rate:Normal     Neuro/Psych    GI/Hepatic Neg liver ROS, GERD  ,  Endo/Other  negative endocrine ROS  Renal/GU negative Renal ROS     Musculoskeletal   Abdominal   Peds  Hematology negative hematology ROS (+)   Anesthesia Other Findings   Reproductive/Obstetrics (+) Pregnancy                             Anesthesia Physical Anesthesia Plan  ASA: II  Anesthesia Plan: Epidural   Post-op Pain Management:    Induction:   Airway Management Planned:   Additional Equipment:   Intra-op Plan:   Post-operative Plan:   Informed Consent: I have reviewed the patients History and Physical, chart, labs and discussed the procedure including the risks, benefits and alternatives for the proposed anesthesia with the patient or authorized representative who has indicated his/her understanding and acceptance.   Dental Advisory Given  Plan Discussed with: Anesthesiologist  Anesthesia Plan Comments:         Anesthesia Quick Evaluation

## 2015-10-26 NOTE — Discharge Instructions (Signed)
Vaginal Delivery, Care After Refer to this sheet in the next few weeks. These discharge instructions provide you with information on caring for yourself after delivery. Your caregiver may also give you specific instructions. Your treatment has been planned according to the most current medical practices available, but problems sometimes occur. Call your caregiver if you have any problems or questions after you go home. HOME CARE INSTRUCTIONS 1. Take over-the-counter or prescription medicines only as directed by your caregiver or pharmacist. 2. Do not drink alcohol, especially if you are breastfeeding or taking medicine to relieve pain. 3. Do not smoke tobacco. 4. Continue to use good perineal care. Good perineal care includes: 1. Wiping your perineum from back to front 2. Keeping your perineum clean. 3. You can do sitz baths twice a day, to help keep this area clean 5. Do not use tampons, douche or have sex until your caregiver says it is okay. 6. Shower only and avoid sitting in submerged water, aside from sitz baths 7. Wear a well-fitting bra that provides breast support. 8. Eat healthy foods. 9. Drink enough fluids to keep your urine clear or pale yellow. 10. Eat high-fiber foods such as whole grain cereals and breads, brown rice, beans, and fresh fruits and vegetables every day. These foods may help prevent or relieve constipation. 11. Avoid constipation with high fiber foods or medications, such as miralax or metamucil 12. Follow your caregiver's recommendations regarding resumption of activities such as climbing stairs, driving, lifting, exercising, or traveling. 13. Talk to your caregiver about resuming sexual activities. Resumption of sexual activities is dependent upon your risk of infection, your rate of healing, and your comfort and desire to resume sexual activity. 14. Try to have someone help you with your household activities and your newborn for at least a few days after you leave  the hospital. 15. Rest as much as possible. Try to rest or take a nap when your newborn is sleeping. 16. Increase your activities gradually. 17. Keep all of your scheduled postpartum appointments. It is very important to keep your scheduled follow-up appointments. At these appointments, your caregiver will be checking to make sure that you are healing physically and emotionally. SEEK MEDICAL CARE IF:   You are passing large clots from your vagina. Save any clots to show your caregiver.  You have a foul smelling discharge from your vagina.  You have trouble urinating.  You are urinating frequently.  You have pain when you urinate.  You have a change in your bowel movements.  You have increasing redness, pain, or swelling near your vaginal incision (episiotomy) or vaginal tear.  You have pus draining from your episiotomy or vaginal tear.  Your episiotomy or vaginal tear is separating.  You have painful, hard, or reddened breasts.  You have a severe headache.  You have blurred vision or see spots.  You feel sad or depressed.  You have thoughts of hurting yourself or your newborn.  You have questions about your care, the care of your newborn, or medicines.  You are dizzy or light-headed.  You have a rash.  You have nausea or vomiting.  You were breastfeeding and have not had a menstrual period within 12 weeks after you stopped breastfeeding.  You are not breastfeeding and have not had a menstrual period by the 12th week after delivery.  You have a fever. SEEK IMMEDIATE MEDICAL CARE IF:   You have persistent pain.  You have chest pain.  You have shortness of breath.    You faint.  You have leg pain.  You have stomach pain.  Your vaginal bleeding saturates two or more sanitary pads in 1 hour. MAKE SURE YOU:   Understand these instructions.  Will watch your condition.  Will get help right away if you are not doing well or get worse. Document Released:  08/04/2000 Document Revised: 12/22/2013 Document Reviewed: 04/03/2012 ExitCare Patient Information 2015 [employer]. This information is not intended to replace advice given to you by your health care provider. Make sure you discuss any questions you have with your health care provider.  Sitz Bath A sitz bath is a warm water bath taken in the sitting position. The water covers only the hips and butt (buttocks). We recommend using one that fits in the toilet, to help with ease of use and cleanliness. It may be used for either healing or cleaning purposes. Sitz baths are also used to relieve pain, itching, or muscle tightening (spasms). The water may contain medicine. Moist heat will help you heal and relax.  HOME CARE  Take 3 to 4 sitz baths a day. 18. Fill the bathtub half-full with warm water. 19. Sit in the water and open the drain a little. 20. Turn on the warm water to keep the tub half-full. Keep the water running constantly. 21. Soak in the water for 15 to 20 minutes. 22. After the sitz bath, pat the affected area dry. GET HELP RIGHT AWAY IF: You get worse instead of better. Stop the sitz baths if you get worse. MAKE SURE YOU:  Understand these instructions.  Will watch your condition.  Will get help right away if you are not doing well or get worse. Document Released: 09/14/2004 Document Revised: 05/01/2012 Document Reviewed: 12/05/2010 ExitCare Patient Information 2015 [employer]. This information is not intended to replace advice given to you by your health care provider. Make sure you discuss any questions you have with your health care provider.    

## 2015-10-26 NOTE — Progress Notes (Addendum)
L&D Note  10/26/2015 - 2:10 PM  20 y.o. G1 [redacted]w[redacted]d . Pregnancy complicated by BMI 35, chronic LBP, h/o THC and ETOH use in early pregnancy, h/o UTI with negative TOC, MI asthma (rare albuterol use), late [facility]  [name] is admitted for IOL for gHTN at term   Subjective:  Feeling more pain and pressure.   Objective:   Filed Vitals:   10/26/15 1205 10/26/15 1226 10/26/15 1245 10/26/15 1305  BP: 153/89 138/80 127/80 135/84  Pulse: 95 108 100 113  Temp:      TempSrc:      Resp:      Height:      Weight:      SpO2: 99%       Current Vital Signs 24h Vital Sign Ranges  T 98.2 F (36.8 C) Temp  Avg: 98.2 F (36.8 C)  Min: 98.1 F (36.7 C)  Max: 98.2 F (36.8 C)  BP 135/84 mmHg BP  Min: 127/80  Max: 171/112  HR (!) 113 Pulse  Avg: 102.5  Min: 82  Max: 116  RR 20 Resp  Avg: 19.5  Min: 18  Max: 20  SaO2 99 % Not Delivered SpO2  Avg: 98.1 %  Min: 95 %  Max: 99 %       24 Hour I/O Current Shift I/O  Time Ins Outs       FHR: 120 baseline, + rare accels, occasional slight early and variable decels, mod var Toco: q105m Gen: mild to moderate distress with UCs SVE: 7-8/90/0/mild caput. AROM for scant clear fluid. IUPC and FSE placed  Labs:  No new labs  Medications Current Facility-Administered Medications  Medication Dose Route Frequency Provider Last Rate Last Dose  . acetaminophen (TYLENOL) tablet 650 mg  650 mg Oral Q4H PRN Vena Austria, MD      . butorphanol (STADOL) injection 2 mg  2 mg Intravenous Q1H PRN Vena Austria, MD   2 mg at 10/26/15 0834  . citric acid-sodium citrate (ORACIT) solution 30 mL  30 mL Oral Q2H PRN Vena Austria, MD      . diphenhydrAMINE (BENADRYL) injection 12.5 mg  12.5 mg Intravenous Q4H PRN Rosaria Ferries, MD       Or  . diphenhydrAMINE (BENADRYL) capsule 25 mg  25 mg Oral Q4H PRN Rosaria Ferries, MD      . fentaNYL 2.5 mcg/ml w/ropivacaine 0.2% (preservative free) in normal saline 100 mL EPIDURAL Infusion in 150 ml  Intravia Bag  10 mL/hr Epidural Continuous Rosaria Ferries, MD 10 mL/hr at 10/26/15 1051 10 mL/hr at 10/26/15 1051  . lactated ringers infusion 500-1,000 mL  500-1,000 mL Intravenous PRN Vena Austria, MD      . lactated ringers infusion   Intravenous Continuous Vena Austria, MD 125 mL/hr at 10/25/15 1950    . lidocaine (PF) (XYLOCAINE) 1 % injection 30 mL  30 mL Subcutaneous PRN Vena Austria, MD      . nalbuphine (NUBAIN) injection 5 mg  5 mg Intravenous Q4H PRN Rosaria Ferries, MD       Or  . nalbuphine (NUBAIN) injection 5 mg  5 mg Subcutaneous Q4H PRN Rosaria Ferries, MD      . nalbuphine (NUBAIN) injection 5 mg  5 mg Intravenous Once PRN Rosaria Ferries, MD       Or  . nalbuphine (NUBAIN) injection 5 mg  5 mg Subcutaneous Once PRN Rosaria Ferries, MD      .  naloxone (NARCAN) 2 mg in dextrose 5 % 250 mL infusion  1-4 mcg/kg/hr Intravenous Continuous PRN Rosaria FerriesJoseph K Piscitello, MD      . naloxone [facility](NARCAN) injection 0.4 mg  0.4 mg Intravenous PRN Rosaria FerriesJoseph K Piscitello, MD       And  . sodium chloride flush (NS) 0.9 % injection 3 mL  3 mL Intravenous PRN Rosaria FerriesJoseph K Piscitello, MD      . ondansetron [facility](ZOFRAN) injection 4 mg  4 mg Intravenous Q6H PRN Vena AustriaAndreas Staebler, MD   4 mg at 10/26/15 0630  . ondansetron (ZOFRAN) injection 4 mg  4 mg Intravenous Q8H PRN Rosaria FerriesJoseph K Piscitello, MD      . oxytocin (PITOCIN) IV BOLUS FROM BAG  500 mL Intravenous Continuous Vena AustriaAndreas Staebler, MD      . oxytocin (PITOCIN) IV infusion 40 units in LR 1000 mL - Premix  2.5 Units/hr Intravenous Continuous Vena AustriaAndreas Staebler, MD      . penicillin G potassium 2.5 Million Units in dextrose 5 % 100 mL IVPB  2.5 Million Units Intravenous Q4H Vena AustriaAndreas Staebler, MD   2.5 Million Units at 10/26/15 1309  . terbutaline (BRETHINE) injection 0.25 mg  0.25 mg Subcutaneous Once PRN Vena AustriaAndreas Staebler, MD      . zolpidem [facility](AMBIEN) tablet 5 mg  5 mg Oral QHS PRN Vena AustriaAndreas Staebler, MD   5 mg at 10/25/15 2252    Facility-Administered Medications Ordered in Other Encounters  Medication Dose Route Frequency Provider Last Rate Last Dose  . bupivacaine (PF) (MARCAINE) 0.25 % injection    Anesthesia Intra-op Ginger CarneStephanie Michelet, CRNA   4 mL at 10/26/15 1045  . lidocaine (PF) (XYLOCAINE) 1 % injection    Anesthesia Intra-op Ginger CarneStephanie Michelet, CRNA   2 mL at 10/26/15 1030  . lidocaine-EPINEPHrine 1.5 %-1:200000 injection    Anesthesia Intra-op Ginger CarneStephanie Michelet, CRNA   3 mL at 10/26/15 1039    Assessment & Plan:  Pt doing well *IUP: category I currently. Fetal status reassuring with mod variability and normal baseline and occasional accels *Labor: doing well on her own. Augment with pitocin prn. EFW 3700gm, pelvis feels adequate. FSC and IUPC placed due to slight maternal tachycardia and low normal fetal baseline and difficulty tracing contractions, respectively. -s/p cervidil cx ripening *gHTN: stable mild to moderate range BPs. No s/s of pre-x and negative admit labs *GBS pos: continue PCN. S/p two doses already *h/o THC use: negative UDS on admit *Analgesia: will let anesthesia know about pt desire for better analgesia.  [place] Bingharlie Cyera Balboni, Montez HagemanJr MD [facility] Pager [phone]

## 2015-10-26 NOTE — Plan of Care (Signed)
Dr Vergie Livingpickens notified of variable decels while pt pushing,

## 2015-10-26 NOTE — Discharge Summary (Addendum)
Obstetrical Discharge Summary  Date of Admission: 10/25/2015 Date of Discharge: 10/28/2015  Primary OB: [facility]  Gestational Age at Delivery: 5387w3d   Antepartum complications: BMI 35, chronic LBP, h/o THC and ETOH use in early pregnancy, h/o UTI with negative TOC, MI asthma (rare albuterol use), [facility] Reason for Admission: IOL for gestational HTN at term Date of Delivery: 10/26/2015  Delivered By: Cornelia Copaharlie Zain Lankford, Jr MD Delivery Type: forceps, low Intrapartum complications/course: gestational HTN Anesthesia: epidural Placenta: Delivered and expressed via active management. Intact: yes. To pathology: no.  Laceration: 1st degree left vaginal (repaired). Intact perineum Episiotomy: none Baby: Liveborn female, APGARs 10/9, weight 2840 g.   Discharge Diagnosis: Term intrauterine pregnancy delivered Gestational hypertension  Postpartum course: Postpartum course remarkable for asymptomatic anemia (H&H=8.6gm/dl and 16.1%25.2%). Otherwise, patient reports that her bleeding is slowing, that she is tolerating a regular diet and that she is voiding without difficulty. She is normotensive. Her pain is decreasing. Baby is breast feeding well. She was discharged on postpartum day #2  Discharge Vital Signs: Patient Vitals for the past 6 hrs:  BP Temp Temp src Pulse Resp  10/28/15 0801 119/81 mmHg 98 F (36.7 C) Oral 88 20  10/28/15 0320 - 98.4 F (36.9 C) Oral - -    Discharge Exam:  NAD Perineum: intact Abdomen: firm fundus at the umbilicus. Ext: no evidence of DVT, no TTP Lochia: mild  Disposition: Home  Rh Immune globulin given: not applicable Rubella vaccine given: yes Tdap vaccine given in AP or PP setting: yes Flu vaccine given in AP or PP setting: no  Contraception: undecided, discussed options  Prenatal/Postnatal Panel: A POS//Rubella Not immune//Varicella Immune//RPR negative//HIV negative/HepB Surface Ag negative//pap not applicable//plans to breastfeed  Plan:  Sherry Pourlicia Alonso  Malone was discharged to home in good condition. Follow-up appointment with Dr. Vergie LivingPickens in 6  weeks for a post partum visit  Discharge Medications:   Medication List    STOP taking these medications        prenatal multivitamin Tabs tablet      TAKE these medications        CITRANATAL 90 DHA 90-1 & 300 MG Misc  Take one tablet and one gel tab daily/ 30 day supply     ibuprofen 600 MG tablet  Commonly known as:  ADVIL,MOTRIN  Take 1 tablet (600 mg total) by mouth every 6 (six) hours as needed for headache, mild pain, moderate pain or cramping.     oxyCODONE-acetaminophen 5-325 MG tablet  Commonly known as:  PERCOCET/ROXICET  Take 1 tablet by mouth every 6 (six) hours as needed for moderate pain or severe pain.       Farrel ConnersGUTIERREZ, COLLEEN, CNM

## 2015-10-26 NOTE — Progress Notes (Signed)
  Labor Progress Note   20 y.o. G1P0000 @ [redacted]w[redacted]d, admitted for  Pregnancy, Labor Management. Gestational Hypertension  Subjective:  Pt seen at 0930 and is uncomfortable with frequent contractions. Says the Stadol does not work and wonders when she can have epidural.  Objective:  BP 142/85 mmHg  Pulse 103  Temp(Src) 98.2 F (36.8 C) (Oral)  Resp 20  Ht '5\' 2"'$  (1.575 m)  Wt 82.101 kg (181 lb)  BMI 33.10 kg/m2  SpO2 98%  LMP 12/20/2014 Abd: mild Extr: trace to 1+ bilateral pedal edema SVE: CERVIX: 3 cm dilated, 90 effaced, 0 station  EFM: FHR: 130 bpm, EFM adjusted after period of fetal tracing the same as maternal, variability: moderate,  accelerations:  Present,  decelerations:  Absent Toco: Frequency: difficult to assess from Toco. Per pt report every 1-3 minutes  Results for ARIELLY, BRUNN(MRN [id number] as of 10/26/2015 10:42   Ref. Range 10/25/2015 17:52  Sodium Latest Ref Range: 135-145 mmol/L 138  Potassium Latest Ref Range: 3.5-5.1 mmol/L 3.9  Chloride Latest Ref Range: 101-111 mmol/L 109  CO2 Latest Ref Range: 22-32 mmol/L 20 (L)  BUN Latest Ref Range: 6-20 mg/dL 9  Creatinine Latest Ref Range: 0.44-1.00 mg/dL 0.62  Calcium Latest Ref Range: 8.9-10.3 mg/dL 8.6 (L)  EGFR (Non-African Amer.) Latest Ref Range: >60 mL/min >60  EGFR (African American) Latest Ref Range: >60 mL/min >60  Glucose Latest Ref Range: 65-99 mg/dL 101 (H)  Anion gap Latest Ref Range: 5-15  9  Alkaline Phosphatase Latest Ref Range: 38-126 U/L 184 (H)  Albumin Latest Ref Range: 3.5-5.0 g/dL 3.2 (L)  Uric Acid, Serum Latest Ref Range: 2.3-6.6 mg/dL 4.1  AST Latest Ref Range: 15-41 U/L 16  ALT Latest Ref Range: 14-54 U/L 7 (L)  Total Protein Latest Ref Range: 6.5-8.1 g/dL 6.5  Total Bilirubin Latest Ref Range: 0.3-1.2 mg/dL 0.5  LDH Latest Ref Range: 98-192 U/L 134  WBC Latest Ref Range: 3.6-11.0 K/uL 7.7  RBC Latest Ref Range: 3.80-5.20 MIL/uL 4.06  Hemoglobin Latest Ref Range: 12.0-16.0  g/dL 11.3 (L)  HCT Latest Ref Range: 35.0-47.0 % 32.7 (L)  MCV Latest Ref Range: 80.0-100.0 fL 80.6  MCH Latest Ref Range: 26.0-34.0 pg 27.8  MCHC Latest Ref Range: 32.0-36.0 g/dL 34.5  RDW Latest Ref Range: 11.5-14.5 % 13.8  Platelets Latest Ref Range: 150-440 K/uL 154      Assessment & Plan:  G1P0000 @ 436w3dadmitted for  Pregnancy and Labor/Delivery Management Cervical change following removal of cervidil without further augmentation at this time.  1. Pain management: S/P stadol 2 mg x 3 doses. Plans epidural  2. FWB: FHT category I  3. ID: GBS positive: Ppx with Ampicillin 4. Labor management: Augment with Pitocin PRN All discussed with patient, see orders  Jasmon Graffam, CNM   This patient and plan were discussed with Dr PiIlda Basset/02/2016

## 2015-10-26 NOTE — Anesthesia Procedure Notes (Signed)
Epidural Patient location during procedure: OB Start time: 10/26/2015 10:10 AM End time: 10/26/2015 10:40 AM  Staffing Anesthesiologist: Rosaria FerriesPISCITELLO, JOSEPH K Resident/CRNA: Ginger CarneMICHELET, Chong Wojdyla Performed by: anesthesiologist and resident/CRNA   Preanesthetic Checklist Completed: patient identified, site marked, surgical consent, pre-op evaluation, timeout performed, IV checked, risks and benefits discussed and monitors and equipment checked  Epidural Patient position: sitting Prep: Betadine Patient monitoring: heart rate, continuous pulse ox and blood pressure Approach: midline Location: L4-L5 Injection technique: LOR saline  Needle:  Needle type: Tuohy  Needle gauge: 17 G Needle length: 9 cm and 9 Needle insertion depth: 9 cm Catheter type: closed end flexible Catheter size: 19 Gauge Catheter at skin depth: 12 cm Test dose: negative and 1.5% lidocaine with Epi 1:200 K  Assessment Sensory level: T8 Events: blood not aspirated, injection not painful, no injection resistance, negative IV test and no paresthesia  Additional Notes  Multiple attempts  Pt tolerated well Patient tolerated the insertion well without immediate complications. Reason for block:procedure for pain

## 2015-10-27 ENCOUNTER — Encounter: Payer: Self-pay | Admitting: Obstetrics and Gynecology

## 2015-10-27 LAB — CBC WITH DIFFERENTIAL/PLATELET
Basophils Absolute: 0 10*3/uL (ref 0–0.1)
Basophils Relative: 0 %
Eosinophils Absolute: 0 10*3/uL (ref 0–0.7)
Eosinophils Relative: 0 %
HCT: 26.3 % — ABNORMAL LOW (ref 35.0–47.0)
Hemoglobin: 9.1 g/dL — ABNORMAL LOW (ref 12.0–16.0)
Lymphocytes Relative: 10 %
Lymphs Abs: 1.2 10*3/uL (ref 1.0–3.6)
MCH: 27.7 pg (ref 26.0–34.0)
MCHC: 34.6 g/dL (ref 32.0–36.0)
MCV: 80 fL (ref 80.0–100.0)
Monocytes Absolute: 0.9 10*3/uL (ref 0.2–0.9)
Monocytes Relative: 8 %
Neutro Abs: 9.9 10*3/uL — ABNORMAL HIGH (ref 1.4–6.5)
Neutrophils Relative %: 82 %
Platelets: 146 10*3/uL — ABNORMAL LOW (ref 150–440)
RBC: 3.28 MIL/uL — ABNORMAL LOW (ref 3.80–5.20)
RDW: 13.7 % (ref 11.5–14.5)
WBC: 12.1 10*3/uL — ABNORMAL HIGH (ref 3.6–11.0)

## 2015-10-27 LAB — CBC
HCT: 25.2 % — ABNORMAL LOW (ref 35.0–47.0)
Hemoglobin: 8.6 g/dL — ABNORMAL LOW (ref 12.0–16.0)
MCH: 27.7 pg (ref 26.0–34.0)
MCHC: 34.3 g/dL (ref 32.0–36.0)
MCV: 80.6 fL (ref 80.0–100.0)
Platelets: 125 10*3/uL — ABNORMAL LOW (ref 150–440)
RBC: 3.13 MIL/uL — ABNORMAL LOW (ref 3.80–5.20)
RDW: 13.8 % (ref 11.5–14.5)
WBC: 9.3 10*3/uL (ref 3.6–11.0)

## 2015-10-27 LAB — RPR: RPR Ser Ql: NONREACTIVE

## 2015-10-27 MED ORDER — SODIUM CHLORIDE 0.9 % IV BOLUS (SEPSIS)
1000.0000 mL | Freq: Once | INTRAVENOUS | Status: AC
  Administered 2015-10-27: 1000 mL via INTRAVENOUS

## 2015-10-27 MED ORDER — FERROUS GLUCONATE 324 (38 FE) MG PO TABS
324.0000 mg | ORAL_TABLET | Freq: Two times a day (BID) | ORAL | Status: DC
  Administered 2015-10-27 – 2015-10-28 (×3): 324 mg via ORAL
  Filled 2015-10-27 (×3): qty 1

## 2015-10-27 MED ORDER — METHYLERGONOVINE MALEATE 0.2 MG PO TABS
0.2000 mg | ORAL_TABLET | ORAL | Status: AC
  Administered 2015-10-27 – 2015-10-28 (×8): 0.2 mg via ORAL
  Filled 2015-10-27 (×8): qty 1

## 2015-10-27 NOTE — Progress Notes (Signed)
I spoke with Dr. Vergie LivingPickens about the patient's bleeding and clots while he was on the floor rounding on another patient. He stated he was not surprised due to her weight, but to call if anything changed.

## 2015-10-27 NOTE — Progress Notes (Signed)
Post Partum Day 1 Subjective: Doing well, no complaints.  Tolerating regular diet, pain with PO meds, voiding and ambulating without difficulty.  No CP SOB F/C N/V or leg pain No HA, change of vision, RUQ/epigastric pain  Objective: BP 121/75 mmHg  Pulse 105  Temp(Src) 98.4 F (36.9 C) (Oral)  Resp 20  Ht 5' 2" (1.575 m)  Wt 82.101 kg (181 lb)  BMI 33.10 kg/m2  SpO2 99%  LMP 12/20/2014  Breastfeeding? Unknown  Physical Exam:  General: NAD CV: RRR Pulm: nl effort, CTABL Lochia: moderate Uterine Fundus: fundus firm and below umbilicus DVT Evaluation: no cords, ttp LEs    Recent Labs  10/27/15 0036 10/27/15 0643  HGB 9.1* 8.6*  HCT 26.3* 25.2*  WBC 12.1* 9.3  PLT 146* 125*    Assessment/Plan: 20 y.o. G1P1001 postpartum day # 1  1. Continue routine postpartum care  2. Breastfeeding  3. Contraception: unsure at this time  4. Rubella not immune (will need MMR before discharge)/Varicella immune  5. Disposition: Home tomorrow    GLEDHILL,JANE, CNM   This patient and plan were discussed with Dr Harris 10/27/2015     

## 2015-10-27 NOTE — Progress Notes (Signed)
OB Note Went to see patient regarding PP vaginal bleeding. Has had 4 pads since coming over from L&D with decreasing amounts.  Current pad has superficial saturation of normal appearing blood/lochia over about a 8x3cm area. Firm fundus below the umbilicus, nttp and no clots or bleeding with deep massage. Sterile gloves put on and bimanual done with cervix about 2-3cm dilated and limited by patient discomfort but only a small amount of clot scooped out and no additional bleeding afterwards.  NAD, normal s1 and s2, no MRGs, HR 100s, ctab No chest pain, sob or s/s of anemia   Filed Vitals:   10/26/15 1950 10/26/15 2025 10/27/15 0034 10/27/15 0040  BP: 140/82 144/90 127/76 123/72  Pulse: 109 115  112  Temp:  98.3 F (36.8 C) 98 F (36.7 C)   TempSrc:  Oral Oral   Resp:  18 18   Height:      Weight:      SpO2:  97% 100% 98%    Current Vital Signs 24h Vital Sign Ranges  T 98 F (36.7 C) Temp  Avg: 98.2 F (36.8 C)  Min: 98 F (36.7 C)  Max: 98.3 F (36.8 C)  BP 123/72 mmHg (recheck) BP  Min: 121/86  Max: 194/174  HR (!) 112 (recheck) Pulse  Avg: 110.6  Min: 82  Max: 159  RR 18 Resp  Avg: 19.3  Min: 18  Max: 20  SaO2 98 % Not Delivered SpO2  Avg: 98.1 %  Min: 95 %  Max: 100 %       24 Hour I/O Current Shift I/O  Time Ins Outs 03/07 0701 - 03/08 0700 In: -  Out: 2100 [Urine:1900]      Recent Labs Lab 10/25/15 1752 10/27/15 0036  WBC 7.7 12.1*  HGB 11.3* 9.1*  HCT 32.7* 26.3*  PLT 154 146*    PPH but patient stable Would put EBL at about 650-75900mL with 300mL from the delivery. She received PO methergine a little after MN and will continue this for 8 doses, as well as IV MIVF with pitocin after the 1L NS bolus goes in. Rpt CBC for 0500 and RN to let me know if bleeding doesn't continue to get better  Cornelia Copaharlie Kortlynn Poust, Jr MD [facility]  Pager: [phone]

## 2015-10-27 NOTE — Progress Notes (Signed)
Dr. Vergie LivingPickens was notified by phone of patient's continued bleeding and her current vitals

## 2015-10-27 NOTE — Progress Notes (Signed)
Dr. Vergie LivingPickens was notified by phone of the patient's continued bleeding. He stated he would order PO Methergine at this time.

## 2015-10-27 NOTE — Progress Notes (Signed)
Dr. Vergie LivingPickens was notified by phone of the patient's CBC results (Hgb 9.1 & Hct 26.3)

## 2015-10-27 NOTE — Anesthesia Postprocedure Evaluation (Signed)
Anesthesia Post Note  Patient: Sherry Malone  Procedure(s) Performed: * No procedures listed *  Patient location during evaluation: Mother Baby Anesthesia Type: Epidural Level of consciousness: awake and alert and oriented Pain management: pain level controlled Vital Signs Assessment: post-procedure vital signs reviewed and stable Respiratory status: spontaneous breathing Cardiovascular status: stable Postop Assessment: no headache Anesthetic complications: no    Last Vitals:  Filed Vitals:   10/27/15 0459 10/27/15 0720  BP: 122/71 113/73  Pulse: 103 97  Temp: 36.7 C 36.7 C  Resp: 18 18    Last Pain:  Filed Vitals:   10/27/15 0720  PainSc: 4                  Raphael Espe,  Alessandra BevelsJennifer M

## 2015-10-28 MED ORDER — OXYCODONE-ACETAMINOPHEN 5-325 MG PO TABS: 1.0000 | ORAL_TABLET | Freq: Four times a day (QID) | ORAL | Status: DC | PRN

## 2015-10-28 MED ORDER — CITRANATAL 90 DHA 90-1 & 300 MG PO MISC: ORAL | Status: DC

## 2015-10-28 MED ORDER — IBUPROFEN 600 MG PO TABS: 600.0000 mg | ORAL_TABLET | Freq: Four times a day (QID) | ORAL | Status: DC | PRN

## 2015-10-28 NOTE — Progress Notes (Signed)
Patient discharged home with infant and significant other. Discharge instructions, prescriptions and follow up appointment given to and reviewed with patient and significant other. Patient verbalized understanding. Escorted out via wheelchair by auxillary. 

## 2015-10-30 ENCOUNTER — Ambulatory Visit: Payer: Self-pay

## 2015-10-30 NOTE — Lactation Note (Signed)
This note was copied from a baby's chart. Lactation Consultation Note  Patient Name: Sherry Malone [id number]'XToday's Date: 10/30/2015 Reason for consult: Follow-up assessment Mom's breasts were full, lumpy, firm and warm to touch.  [name] was unable to achieve latch d/t engorged breasts causing nipple to flatten and inability to compress areola.  Pre pumped for 5 minutes and got 30 ml transitional breast milk.  Once areola was soften enough, we attempted to latch to large lt nipple without success.  Nipple shield (#24) applied and filled with breast milk.  She started good rhythmic sucking with audible swallows for short interval before falling asleep.  She took in 10 ml from left breast per pre and post feeding weight.  Mom had just given 30 ml of expressed breast milk before coming in for consult so [name] was not real hungry.  Once aroused and put on right breast with nipple shield, she immediately began good rhythmic sucking with swallows.  She took another 12 ml from right breast.  Mom gave return demonstration of setting up DEBP and placing #24 nipple shield.  Loaning mom DEBP for 1 week until no longer engorged and can get [name] back to exclusively breast feeding without nipple shield.     Maternal Data Formula Feeding for Exclusion: No Has patient been taught Hand Expression?: Yes Does the patient have breastfeeding experience prior to this delivery?: No  Feeding Feeding Type: Breast Fed Length of feed: 10 min  LATCH Score/Interventions Latch: Grasps breast easily, tongue down, lips flanged, rhythmical sucking. Intervention(s): Adjust position;Assist with latch;Breast massage;Breast compression  Audible Swallowing: Spontaneous and intermittent  Type of Nipple: Everted at rest and after stimulation Intervention(s): Reverse pressure;Double electric pump  Comfort (Breast/Nipple): Filling, red/small blisters or bruises, mild/mod discomfort Problem noted: Engorgment;Cracked, bleeding,  blisters, bruises Intervention(s): Ice;Hand expression;Reverse pressure;Other (comment) (Pre pumping) Intervention(s): Expressed breast milk to nipple;Reverse pressure;Double electric pump  Problem noted: Cracked, bleeding, blisters, bruises;Mild/Moderate discomfort;Filling Interventions (Filling): Massage;Reverse pressure;Firm support;Frequent nursing;Double electric pump Interventions  (Cracked/bleeding/bruising/blister): Expressed breast milk to nipple;Reverse pressure;Double electric pump Interventions (Mild/moderate discomfort): Hand massage;Hand expression;Reverse pressue;Pre-pump if needed;Comfort gels;Breast shields  Hold (Positioning): No assistance needed to correctly position infant at breast. Intervention(s): Support Pillows;Position options;Skin to skin  LATCH Score: 9  Lactation Tools Discussed/Used Tools: Nipple Shields;Pump;Comfort gels;Flanges Nipple shield size: 24 Flange Size: 30 Breast pump type: Double-Electric Breast Pump WIC Program: Yes Pump Review: Setup, frequency, and cleaning;Milk Storage Initiated by:: S.Lulla Linville,RN,BSN,IBCLC Date initiated:: 10/30/15   Consult Status Consult Status: PRN Follow-up type: Call as needed    Louis MeckelWilliams, Caley Ciaramitaro Kay 10/30/2015, 3:29 PM

## 2016-05-06 ENCOUNTER — Encounter: Payer: Self-pay | Admitting: Emergency Medicine

## 2016-05-06 ENCOUNTER — Emergency Department
Admission: EM | Admit: 2016-05-06 | Discharge: 2016-05-06 | Disposition: A | Discharge status: Home / Self Care | Payer: PRIVATE HEALTH INSURANCE | Attending: Student in an Organized Health Care Education/Training Program | Admitting: Student in an Organized Health Care Education/Training Program

## 2016-05-06 ENCOUNTER — Emergency Department: Payer: PRIVATE HEALTH INSURANCE

## 2016-05-06 DIAGNOSIS — K8020 Calculus of gallbladder without cholecystitis without obstruction: Secondary | ICD-10-CM | POA: Insufficient documentation

## 2016-05-06 DIAGNOSIS — J45909 Unspecified asthma, uncomplicated: Secondary | ICD-10-CM | POA: Diagnosis not present

## 2016-05-06 DIAGNOSIS — Z79899 Other long term (current) drug therapy: Secondary | ICD-10-CM | POA: Diagnosis not present

## 2016-05-06 DIAGNOSIS — Z87891 Personal history of nicotine dependence: Secondary | ICD-10-CM | POA: Diagnosis not present

## 2016-05-06 DIAGNOSIS — R1011 Right upper quadrant pain: Secondary | ICD-10-CM

## 2016-05-06 LAB — COMPREHENSIVE METABOLIC PANEL
ALT: 44 U/L (ref 14–54)
AST: 83 U/L — ABNORMAL HIGH (ref 15–41)
Albumin: 4.5 g/dL (ref 3.5–5.0)
Alkaline Phosphatase: 58 U/L (ref 38–126)
Anion gap: 5 (ref 5–15)
BUN: 12 mg/dL (ref 6–20)
CO2: 24 mmol/L (ref 22–32)
Calcium: 8.9 mg/dL (ref 8.9–10.3)
Chloride: 111 mmol/L (ref 101–111)
Creatinine, Ser: 0.83 mg/dL (ref 0.44–1.00)
GFR calc Af Amer: >60 mL/min (ref >60)
GFR calc non Af Amer: >60 mL/min (ref >60)
Glucose, Bld: 100 mg/dL — ABNORMAL HIGH (ref 65–99)
Potassium: 3.8 mmol/L (ref 3.5–5.1)
Sodium: 140 mmol/L (ref 135–145)
Total Bilirubin: 0.6 mg/dL (ref 0.3–1.2)
Total Protein: 7.3 g/dL (ref 6.5–8.1)

## 2016-05-06 LAB — CBC
HCT: 40.9 % (ref 35.0–47.0)
Hemoglobin: 14 g/dL (ref 12.0–16.0)
MCH: 27.8 pg (ref 26.0–34.0)
MCHC: 34.1 g/dL (ref 32.0–36.0)
MCV: 81.5 fL (ref 80.0–100.0)
Platelets: 171 10*3/uL (ref 150–440)
RBC: 5.02 MIL/uL (ref 3.80–5.20)
RDW: 12.7 % (ref 11.5–14.5)
WBC: 9.7 10*3/uL (ref 3.6–11.0)

## 2016-05-06 LAB — URINALYSIS COMPLETE WITH MICROSCOPIC (ARMC ONLY)
Bacteria, UA: NONE SEEN
Bilirubin Urine: NEGATIVE
Glucose, UA: NEGATIVE mg/dL
Hgb urine dipstick: NEGATIVE
Ketones, ur: NEGATIVE mg/dL
Leukocytes, UA: NEGATIVE
Nitrite: NEGATIVE
Protein, ur: NEGATIVE mg/dL
RBC / HPF: NONE SEEN RBC/hpf (ref 0–5)
Specific Gravity, Urine: 1.015 (ref 1.005–1.030)
Squamous Epithelial / LPF: NONE SEEN
pH: 6 (ref 5.0–8.0)

## 2016-05-06 LAB — LIPASE, BLOOD: Lipase: 35 U/L (ref 11–51)

## 2016-05-06 LAB — POCT PREGNANCY, URINE: Preg Test, Ur: NEGATIVE

## 2016-05-06 MED ORDER — HYDROCODONE-ACETAMINOPHEN 5-325 MG PO TABS: 1.0000 | ORAL_TABLET | Freq: Four times a day (QID) | ORAL | 0 refills | Status: DC | PRN

## 2016-05-06 MED ORDER — ONDANSETRON HCL 4 MG PO TABS: 4.0000 mg | ORAL_TABLET | Freq: Three times a day (TID) | ORAL | 0 refills | Status: DC | PRN

## 2016-05-06 MED ORDER — RANITIDINE HCL 150 MG/10ML PO SYRP
300.0000 mg | ORAL_SOLUTION | Freq: Once | ORAL | Status: AC
  Administered 2016-05-06: 300 mg via ORAL
  Filled 2016-05-06: qty 20

## 2016-05-06 NOTE — ED Provider Notes (Signed)
[facility] Provider Note  ____________________________________________  Time seen: Approximately 2:40 PM  I have reviewed the triage vital signs and the nursing notes.   HISTORY  Chief Complaint Abdominal Pain    HPI Sherry Malone is a 20 y.o. female , NAD, presents to emergency with six-month history of intermittent upper abdominal pain. Patient states she has had off and on upper and right upper quadrant abdominal pain since the birth of her child. Consult with her OB/GYN at her 6 week postpartum visit and was told she could potentially had a stomach ulcer. Was placed on omeprazole and has been taking with food to since that time. Notes that the pain nor frequency of pain hasn't changed since being on the omeprazole. Denies any chest pain, shortness of breath. Has not had any changes in urinary or bowel habits. Has not had any nausea or vomiting. Cannot attribute episodes with anything specific and has not noted any pain to begin soon after a meal. Denies fevers, chills, body aches.   Past Medical History:  Diagnosis Date  . Asthma   . Medical history non-contributory     Patient Active Problem List   Diagnosis Date Noted  . Postpartum care following vaginal delivery 10/25/2015  . Family history of retinitis pigmentosa 06/10/2015    Past Surgical History:  Procedure Laterality Date  . NO PAST SURGERIES      Prior to Admission medications   Medication Sig Start Date End Date Taking? Authorizing Provider  HYDROcodone-acetaminophen (NORCO) 5-325 MG tablet Take 1 tablet by mouth every 6 (six) hours as needed for severe pain. 05/06/16   Emily Massar L Sharene Krikorian, PA-C  ibuprofen (ADVIL,MOTRIN) 600 MG tablet Take 1 tablet (600 mg total) by mouth every 6 (six) hours as needed for headache, mild pain, moderate pain or cramping. 10/28/15   Farrel Connersolleen Gutierrez, CNM  ondansetron (ZOFRAN) 4 MG tablet Take 1 tablet (4 mg total) by mouth every 8  (eight) hours as needed for nausea or vomiting. 05/06/16 05/06/17  Bacilio Abascal L Lexy Meininger, PA-C  oxyCODONE-acetaminophen (PERCOCET/ROXICET) 5-325 MG tablet Take 1 tablet by mouth every 6 (six) hours as needed for moderate pain or severe pain. 10/28/15   Farrel Connersolleen Gutierrez, CNM  Prenat w/o A-FeCbGl-DSS-FA-DHA (CITRANATAL 90 DHA) 90-1 & 300 MG MISC Take one tablet and one gel tab daily/ 30 day supply 10/28/15   Farrel Connersolleen Gutierrez, CNM    Allergies Review of patient's allergies indicates no known allergies.  History reviewed. No pertinent family history.  Social History Social History  Substance Use Topics  . Smoking status: Former Smoker    Packs/day: 0.25    Quit date: 03/05/2015  . Smokeless tobacco: Never Used  . Alcohol use No     Comment: Last use July 2016     Review of Systems  Constitutional: No fever/chills Cardiovascular: No chest pain. Respiratory: No shortness of breath. No wheezing.  Gastrointestinal: Positive epigastric and right upper quadrant abdominal pain.  No nausea, vomiting.  No diarrhea.  No constipation. Genitourinary: Negative for dysuria. No hematuria. No urinary hesitancy, urgency or increased frequency. Musculoskeletal: Negative for general myalgias.  Skin: Negative for rash. Neurological: Negative for headaches 10-point ROS otherwise negative.  ____________________________________________   PHYSICAL EXAM:  VITAL SIGNS: ED Triage Vitals [05/06/16 1257]  Enc Vitals Group     BP 106/60     Pulse Rate 83     Resp 16     Temp 97.5 F (36.4 C)     Temp Source  Oral     SpO2 99 %     Weight 160 lb (72.6 kg)     Height 5\' 2"  (1.575 m)     Head Circumference      Peak Flow      Pain Score 6     Pain Loc      Pain Edu?      Excl. in GC?      Constitutional: Alert and oriented. Well appearing and in no acute distress. Eyes: Conjunctivae are normal without icterus or injection Head: Atraumatic. Neck: Supple with full range of  motion Hematological/Lymphatic/Immunilogical: No cervical lymphadenopathy. Cardiovascular: Normal rate, regular rhythm. Normal S1 and S2.  Good peripheral circulation. Respiratory: Normal respiratory effort without tachypnea or retractions. Lungs CTAB with breath sounds noted in all lung fields. No wheeze, rhonchi, rales. Gastrointestinal: Tenderness to deep palpation of the right upper quadrant and epigastric regions without distention or guarding. All areas of the abdomen are soft without distention or guarding. No rigidity or rebound. Bowel sounds grossly normal active in all quadrants. Musculoskeletal: No lower extremity tenderness nor edema.  No joint effusions. Neurologic:  Normal speech and language. No gross focal neurologic deficits are appreciated.  Skin:  Skin is warm, dry and intact. No rash noted. Psychiatric: Mood and affect are normal. Speech and behavior are normal. Patient exhibits appropriate insight and judgement.   ____________________________________________   LABS (all labs ordered are listed, but only abnormal results are displayed)  Labs Reviewed  COMPREHENSIVE METABOLIC PANEL - Abnormal; Notable for the following:       Result Value   Glucose, Bld 100 (*)    AST 83 (*)    All other components within normal limits  URINALYSIS COMPLETEWITH MICROSCOPIC ([facility] ONLY) - Abnormal; Notable for the following:    Color, Urine YELLOW (*)    APPearance CLEAR (*)    All other components within normal limits  LIPASE, BLOOD  CBC  POC URINE PREG, ED  POCT PREGNANCY, URINE   ____________________________________________  EKG  EKG reveals normal sinus rhythm with a ventricular rate of 78 bpm. No acute changes or evidence of STEMI. EKG also reviewed by Dr. Willy Eddy. ____________________________________________  RADIOLOGY I have personally viewed and evaluated these images (plain radiographs) as part of my medical decision making, as well as reviewing the written  report by the radiologist.  US Abdomen Limited Ruq  Result Date: 05/06/2016 CLINICAL DATA:  20 year old female with right upper quadrant abdominal pain for 6 months. EXAM: US ABDOMEN LIMITED - RIGHT UPPER QUADRANT COMPARISON:  None. FINDINGS: Gallbladder: Multiple mobile gallstones are noted. Upper limits of normal gallbladder wall thickness noted but the gallbladder is contracted. There is no evidence of sonographic Murphy's sign or pericholecystic fluid. The largest gallstone measures 6 mm in diameter. Common bile duct: Diameter: 3 mm. No intrahepatic or extrahepatic biliary dilatation identified. Liver: No focal lesion identified. Within normal limits in parenchymal echogenicity. IMPRESSION: Cholelithiasis without evidence of acute cholecystitis. No biliary dilatation. Electronically Signed   By: Harmon Pier M.D.   On: 05/06/2016 15:27    ____________________________________________    PROCEDURES  Procedure(s) performed: None   Procedures   Medications  ranitidine (ZANTAC) 150 MG/10ML syrup 300 mg (300 mg Oral Given 05/06/16 1520)     ____________________________________________   INITIAL IMPRESSION / ASSESSMENT AND PLAN / ED COURSE  Pertinent labs & imaging results that were available during my care of the patient were reviewed by me and considered in my medical decision making (see  chart for details).  Clinical Course  Comment By Time  I spoke with Dr. Excell Seltzer who is on call for general surgery. Felipa Furnace patient's presentation, imaging and laboratory results. He states patient seems to be a candidate in which she may be discharged home with pain medication and nausea meds and follow up with him in his office next week. He advises that the patient calls office on Monday morning to schedule an appointment. Hope Pigeon, PA-C 09/16 1552    Patient's diagnosis is consistent with Gallstones without obstruction of gallbladder. Patient will be discharged home with prescriptions for  Norco and Zofran to take as directed as needed. Patient strongly advised to follow a low-fat diet. Patient is to follow up with Dr. Excell Seltzer in his office next week for further evaluation and treatment. Patient is given ED precautions to return to the ED for any worsening or new symptoms.    ____________________________________________  FINAL CLINICAL IMPRESSION(S) / ED DIAGNOSES  Final diagnoses:  RUQ abdominal pain  Gallstones without obstruction of gallbladder      NEW MEDICATIONS STARTED DURING THIS VISIT:  Discharge Medication List as of 05/06/2016  3:59 PM    START taking these medications   Details  HYDROcodone-acetaminophen (NORCO) 5-325 MG tablet Take 1 tablet by mouth every 6 (six) hours as needed for severe pain., Starting Sat 05/06/2016, Print    ondansetron (ZOFRAN) 4 MG tablet Take 1 tablet (4 mg total) by mouth every 8 (eight) hours as needed for nausea or vomiting., Starting Sat 05/06/2016, Until Sun 05/06/2017, Print             Ernestene Kiel Whitefish, PA-C 05/06/16 1722    Willy Eddy, MD 05/06/16 2017

## 2016-05-06 NOTE — ED Triage Notes (Addendum)
Pt c/o upper abdominal pain for 6 months. Pt reports talked to her doctor about it but has not had labs done. Has had diarrhea. Reports almost "fell out" at work.

## 2016-05-06 NOTE — ED Notes (Signed)
Pt states she has had upper abdominal pain for 6 mos. Pt says that pain comes and goes and that it is in her upper abdominal area just under her breasts. Pt had a vaginal delivery 6 mos ago.

## 2016-05-06 NOTE — ED Notes (Signed)
Pt verbalized understanding of discharge instructions. NAD at this time. 

## 2016-05-06 NOTE — Discharge Instructions (Signed)
Please follow up with Dr. Excell Seltzerooper in general surgery next week. Please call his office Monday morning to schedule an appointment.

## 2016-05-08 ENCOUNTER — Other Ambulatory Visit: Payer: Self-pay

## 2016-05-09 ENCOUNTER — Ambulatory Visit (INDEPENDENT_AMBULATORY_CARE_PROVIDER_SITE_OTHER): Payer: PRIVATE HEALTH INSURANCE | Admitting: Surgery

## 2016-05-09 ENCOUNTER — Encounter: Payer: Self-pay | Admitting: Surgery

## 2016-05-09 DIAGNOSIS — K8020 Calculus of gallbladder without cholecystitis without obstruction: Secondary | ICD-10-CM | POA: Diagnosis not present

## 2016-05-09 NOTE — Patient Instructions (Signed)
Please give us a call if you have any questions or concerns about your surgery.  Angie will be contacting you with a date.  Blue Sheet was given to patient.

## 2016-05-09 NOTE — Progress Notes (Signed)
Subjective:     Patient ID: Sherry Malone, female   DOB: 1996/06/29, 20 y.o.   MRN: [id number]  HPI  20 year old female who underwent a healthy pregnancy and delivery back in March. The patient states that off and on over the past few weeks she's been having epigastric abdominal pain. The patient states that she has been having some nausea but no vomiting. The patient had been treated with a PPI throughout her pregnancy but states that this is not helping. Patient has had no fever however she does state that the last time she got the abdominal pain started to get some chills with it. Patient has had some diarrhea after eating some fatty foods and having this pain as well. Patient states that her appetite has been decreased over the past few weeks which is having this pain. Patient denies any fever, chills, malaise, vomiting, and constipation or dysuria.  Past Medical History:  Diagnosis Date  . Asthma   . Medical history non-contributory    Past Surgical History:  Procedure Laterality Date  . NO PAST SURGERIES     Family History  Problem Relation Age of Onset  . Hypertension Father   . Diabetes Maternal Grandmother   . Kidney disease Maternal Grandmother   . Gallstones Other    Social History   Social History  . Marital status: Single    Spouse name: N/A  . Number of children: N/A  . Years of education: N/A   Social History Main Topics  . Smoking status: Current Every Day Smoker    Packs/day: 0.25    Types: Cigarettes    Last attempt to quit: 03/05/2015  . Smokeless tobacco: Never Used  . Alcohol use Yes     Comment: rarely  . Drug use:     Frequency: 9.0 times per week    Types: Marijuana, Other-see comments  . Sexual activity: Yes   Other Topics Concern  . None   Social History Narrative  . None    Current Outpatient Prescriptions:  .  albuterol (PROVENTIL HFA;VENTOLIN HFA) 108 (90 Base) MCG/ACT inhaler, Inhale into the lungs., Disp: , Rfl:  .  fluticasone  (FLONASE) 50 MCG/ACT nasal spray, Place into the nose., Disp: , Rfl:  .  HYDROcodone-acetaminophen (NORCO) 5-325 MG tablet, Take 1 tablet by mouth every 6 (six) hours as needed for severe pain., Disp: 12 tablet, Rfl: 0 .  ondansetron (ZOFRAN) 4 MG tablet, Take 1 tablet (4 mg total) by mouth every 8 (eight) hours as needed for nausea or vomiting., Disp: 21 tablet, Rfl: 0 No Known Allergies  Review of Systems  Constitutional: Positive for appetite change, chills and fatigue. Negative for activity change, diaphoresis, fever and unexpected weight change.  HENT: Negative for congestion and sore throat.   Respiratory: Negative for cough, chest tightness and shortness of breath.   Cardiovascular: Negative for chest pain, palpitations and leg swelling.  Gastrointestinal: Positive for abdominal distention, abdominal pain, diarrhea and nausea. Negative for blood in stool, constipation and vomiting.  Genitourinary: Negative for dysuria and hematuria.  Musculoskeletal: Negative for back pain, joint swelling and neck pain.  Skin: Negative for color change, pallor, rash and wound.  Neurological: Negative for dizziness and weakness.  Hematological: Negative for adenopathy. Does not bruise/bleed easily.  Psychiatric/Behavioral: Negative for agitation. The patient is not nervous/anxious.   All other systems reviewed and are negative.      Vitals:   05/09/16 1004  BP: 124/67  Pulse: 95  Temp: 98.5 F (  36.9 C)    Objective:   Physical Exam  Constitutional: She is oriented to person, place, and time. She appears well-developed and well-nourished. No distress.  HENT:  Head: Normocephalic and atraumatic.  Right Ear: External ear normal.  Left Ear: External ear normal.  Nose: Nose normal.  Mouth/Throat: Oropharynx is clear and moist. No oropharyngeal exudate.  Eyes: Conjunctivae and EOM are normal. Pupils are equal, round, and reactive to light. No scleral icterus.  Neck: Normal range of motion. Neck  supple. No tracheal deviation present.  Cardiovascular: Normal rate, regular rhythm, normal heart sounds and intact distal pulses.  Exam reveals no gallop and no friction rub.   No murmur heard. Pulmonary/Chest: Effort normal and breath sounds normal. No respiratory distress. She has no wheezes. She has no rales. She exhibits no tenderness.  Abdominal: Soft. Bowel sounds are normal. She exhibits no distension. There is tenderness. There is no rebound and no guarding.  Epigastric and RUQ tenderness to deep palpation   Musculoskeletal: Normal range of motion. She exhibits no edema, tenderness or deformity.  Neurological: She is oriented to person, place, and time. No cranial nerve deficit.  Skin: Skin is warm and dry. No rash noted. No erythema. No pallor.  Psychiatric: She has a normal mood and affect. Her behavior is normal. Judgment and thought content normal.  Vitals reviewed.      CBC Latest Ref Rng & Units 05/06/2016 10/27/2015 10/27/2015  WBC 3.6 - 11.0 K/uL 9.7 9.3 12.1(H)  Hemoglobin 12.0 - 16.0 g/dL 16.114.0 0.9(U8.6(L) 0.4(V9.1(L)  Hematocrit 35.0 - 47.0 % 40.9 25.2(L) 26.3(L)  Platelets 150 - 440 K/uL 171 125(L) 146(L)   CMP Latest Ref Rng & Units 05/06/2016 10/25/2015 04/05/2015  Glucose 65 - 99 mg/dL 409(W100(H) 119(J101(H) 478(G112(H)  BUN 6 - 20 mg/dL 12 9 9   Creatinine 0.44 - 1.00 mg/dL 9.560.83 2.130.62 0.860.66  Sodium 135 - 145 mmol/L 140 138 133(L)  Potassium 3.5 - 5.1 mmol/L 3.8 3.9 3.3(L)  Chloride 101 - 111 mmol/L 111 109 102  CO2 22 - 32 mmol/L 24 20(L) 20(L)  Calcium 8.9 - 10.3 mg/dL 8.9 5.7(Q8.6(L) 9.1  Total Protein 6.5 - 8.1 g/dL 7.3 6.5 7.6  Total Bilirubin 0.3 - 1.2 mg/dL 0.6 0.5 0.5  Alkaline Phos 38 - 126 U/L 58 184(H) 38  AST 15 - 41 U/L 83(H) 16 28  ALT 14 - 54 U/L 44 7(L) 21   U/S: IMPRESSION: Cholelithiasis without evidence of acute cholecystitis. No biliary dilatation. Assessment:     20 yr old female with symptomatic cholelithiasis     Plan:     I have personally reviewed the patient's  past medical history which only includes asthma and healthy pregnancy. Have also personally reviewed her emergency room visits from August of last year March and a few days ago. I have personally reviewed her laboratory values which show a normal LFTs and alkaline phosphatase as well as normal bilirubin. I have also personally reviewed her ultrasound images that she so multiple gallstones but no inflammation wall thickening fluid or dilatation of the ducts. Also reviewed the radiology read as above.  The risks, benefits, complications, treatment options, and expected outcomes were discussed with the patient. The possibilities of bleeding, recurrent infection, finding a normal gallbladder, perforation of viscus organs, damage to surrounding structures, bile leak, abscess formation, needing a drain placed, the need for additional procedures, reaction to medication, pulmonary aspiration,  failure to diagnose a condition, the possible need to convert to an open procedure,  and creating a complication requiring transfusion or operation were discussed with the patient. The patient and/or family concurred with the proposed plan, giving informed consent. We will schedule her for Laparoscopic cholecystectomy in the first week in October.

## 2016-05-10 ENCOUNTER — Emergency Department
Admission: EM | Admit: 2016-05-10 | Discharge: 2016-05-10 | Disposition: A | Discharge status: Home / Self Care | Payer: PRIVATE HEALTH INSURANCE | Attending: Emergency Medicine | Admitting: Emergency Medicine

## 2016-05-10 ENCOUNTER — Encounter: Payer: Self-pay | Admitting: Emergency Medicine

## 2016-05-10 ENCOUNTER — Telehealth: Payer: Self-pay | Admitting: Surgery

## 2016-05-10 DIAGNOSIS — F17210 Nicotine dependence, cigarettes, uncomplicated: Secondary | ICD-10-CM | POA: Insufficient documentation

## 2016-05-10 DIAGNOSIS — K8020 Calculus of gallbladder without cholecystitis without obstruction: Secondary | ICD-10-CM | POA: Insufficient documentation

## 2016-05-10 DIAGNOSIS — R1010 Upper abdominal pain, unspecified: Secondary | ICD-10-CM | POA: Diagnosis present

## 2016-05-10 DIAGNOSIS — J45909 Unspecified asthma, uncomplicated: Secondary | ICD-10-CM | POA: Insufficient documentation

## 2016-05-10 LAB — URINALYSIS COMPLETE WITH MICROSCOPIC (ARMC ONLY)
Bacteria, UA: NONE SEEN
Bilirubin Urine: NEGATIVE
Glucose, UA: NEGATIVE mg/dL
Ketones, ur: NEGATIVE mg/dL
Leukocytes, UA: NEGATIVE
Nitrite: NEGATIVE
Protein, ur: NEGATIVE mg/dL
Specific Gravity, Urine: 1.018 (ref 1.005–1.030)
pH: 6 (ref 5.0–8.0)

## 2016-05-10 LAB — COMPREHENSIVE METABOLIC PANEL
ALT: 86 U/L — ABNORMAL HIGH (ref 14–54)
AST: 28 U/L (ref 15–41)
Albumin: 4.6 g/dL (ref 3.5–5.0)
Alkaline Phosphatase: 71 U/L (ref 38–126)
Anion gap: 9 (ref 5–15)
BUN: 9 mg/dL (ref 6–20)
CO2: 20 mmol/L — ABNORMAL LOW (ref 22–32)
Calcium: 9.1 mg/dL (ref 8.9–10.3)
Chloride: 111 mmol/L (ref 101–111)
Creatinine, Ser: 0.82 mg/dL (ref 0.44–1.00)
GFR calc Af Amer: >60 mL/min (ref >60)
GFR calc non Af Amer: >60 mL/min (ref >60)
Glucose, Bld: 95 mg/dL (ref 65–99)
Potassium: 4.1 mmol/L (ref 3.5–5.1)
Sodium: 140 mmol/L (ref 135–145)
Total Bilirubin: 0.7 mg/dL (ref 0.3–1.2)
Total Protein: 8 g/dL (ref 6.5–8.1)

## 2016-05-10 LAB — POCT PREGNANCY, URINE: Preg Test, Ur: NEGATIVE

## 2016-05-10 LAB — CBC
HCT: 41.1 % (ref 35.0–47.0)
Hemoglobin: 14.3 g/dL (ref 12.0–16.0)
MCH: 27.8 pg (ref 26.0–34.0)
MCHC: 34.8 g/dL (ref 32.0–36.0)
MCV: 79.9 fL — ABNORMAL LOW (ref 80.0–100.0)
Platelets: 194 10*3/uL (ref 150–440)
RBC: 5.15 MIL/uL (ref 3.80–5.20)
RDW: 12.8 % (ref 11.5–14.5)
WBC: 8.7 10*3/uL (ref 3.6–11.0)

## 2016-05-10 LAB — LIPASE, BLOOD: Lipase: 31 U/L (ref 11–51)

## 2016-05-10 MED ORDER — PROMETHAZINE HCL 25 MG PO TABS: 25.0000 mg | ORAL_TABLET | ORAL | 1 refills | Status: DC | PRN

## 2016-05-10 MED ORDER — LORAZEPAM 2 MG/ML IJ SOLN
1.0000 mg | Freq: Once | INTRAMUSCULAR | Status: AC
  Administered 2016-05-10: 1 mg via INTRAVENOUS
  Filled 2016-05-10: qty 1

## 2016-05-10 MED ORDER — MORPHINE SULFATE (PF) 4 MG/ML IV SOLN
4.0000 mg | Freq: Once | INTRAVENOUS | Status: AC
  Administered 2016-05-10: 4 mg via INTRAVENOUS
  Filled 2016-05-10: qty 1

## 2016-05-10 NOTE — ED Notes (Signed)

## 2016-05-10 NOTE — ED Triage Notes (Addendum)
Pt states she has been felling really hot at work today and has been shaking states she had a fever of 100.6 states she was supposed to have her gallbladder removed on the 3rd of next month. Pt reports upper abd pain and lower right back pain. Reports nausea denies vomiting or diarrhea.

## 2016-05-10 NOTE — ED Provider Notes (Signed)
[facility] Emergency Department Provider Note        Time seen: ----------------------------------------- 3:50 PM on 05/10/2016 -----------------------------------------    I have reviewed the triage vital signs and the nursing notes.   HISTORY  Chief Complaint Abdominal Pain    HPI Sherry Malone is a 20 y.o. female who presents to ER for abdominal pain and fever. Patient states she felt really hot at work today and she's been shaking. Patient states she is post have her gallbladder removed 2 weeks from now, has had persistent upper abdominal pain and lower back pain. She denies vomiting or diarrhea. All of her symptoms started today, she has had persistent abdominal pain although it has not been severe.   Past Medical History:  Diagnosis Date  . Asthma   . Medical history non-contributory     Patient Active Problem List   Diagnosis Date Noted  . Symptomatic cholelithiasis 05/09/2016  . Postpartum care following vaginal delivery 10/25/2015  . Family history of retinitis pigmentosa 06/10/2015  . Menstrual cramp 04/30/2013    Past Surgical History:  Procedure Laterality Date  . NO PAST SURGERIES      Allergies Review of patient's allergies indicates no known allergies.  Social History Social History  Substance Use Topics  . Smoking status: Current Every Day Smoker    Packs/day: 0.25    Types: Cigarettes    Last attempt to quit: 03/05/2015  . Smokeless tobacco: Never Used  . Alcohol use Yes     Comment: rarely    Review of Systems Constitutional: Negative for fever. Cardiovascular: Negative for chest pain. Respiratory: Negative for shortness of breath. Gastrointestinal: Positive for abdominal pain Genitourinary: Negative for dysuria. Musculoskeletal: Positive for back pain Skin: Negative for rash. Neurological: Negative for headaches, focal weakness or numbness.  10-point ROS otherwise  negative.  ____________________________________________   PHYSICAL EXAM:  VITAL SIGNS: ED Triage Vitals  Enc Vitals Group     BP 05/10/16 1536 105/81     Pulse Rate 05/10/16 1536 73     Resp 05/10/16 1536 18     Temp 05/10/16 1536 99.2 F (37.3 C)     Temp Source 05/10/16 1536 Oral     SpO2 05/10/16 1536 98 %     Weight 05/10/16 1536 160 lb (72.6 kg)     Height 05/10/16 1536 5\' 1"  (1.549 m)     Head Circumference --      Peak Flow --      Pain Score 05/10/16 1540 7     Pain Loc --      Pain Edu? --      Excl. in GC? --     Constitutional: Alert and oriented. Anxious, no distress Eyes: Conjunctivae are normal. PERRL. Normal extraocular movements. ENT   Head: Normocephalic and atraumatic.   Nose: No congestion/rhinnorhea.   Mouth/Throat: Mucous membranes are moist.   Neck: No stridor. Cardiovascular: Normal rate, regular rhythm. No murmurs, rubs, or gallops. Respiratory: Normal respiratory effort without tachypnea nor retractions. Breath sounds are clear and equal bilaterally. No wheezes/rales/rhonchi. Gastrointestinal: Mild right upper quadrant tenderness, no rebound or guarding. Normal bowel sounds. Musculoskeletal: Nontender with normal range of motion in all extremities. No lower extremity tenderness nor edema. Neurologic:  Normal speech and language. No gross focal neurologic deficits are appreciated.  Skin:  Skin is warm, dry and intact. No rash noted. Psychiatric: Mood and affect are normal. Speech and behavior are normal.  ____________________________________________  ED COURSE:  Pertinent labs &  imaging results that were available during my care of the patient were reviewed by me and considered in my medical decision making (see chart for details). Clinical Course  Patient resents in no distress, I will give IV Ativan as well as morphine and we will reassess her lab work.  Procedures ____________________________________________   LABS (pertinent  positives/negatives)  Labs Reviewed  COMPREHENSIVE METABOLIC PANEL - Abnormal; Notable for the following:       Result Value   CO2 20 (*)    ALT 86 (*)    All other components within normal limits  CBC - Abnormal; Notable for the following:    MCV 79.9 (*)    All other components within normal limits  URINALYSIS COMPLETEWITH MICROSCOPIC ([facility] ONLY) - Abnormal; Notable for the following:    Color, Urine YELLOW (*)    APPearance CLEAR (*)    Hgb urine dipstick 2+ (*)    Squamous Epithelial / LPF 0-5 (*)    All other components within normal limits  LIPASE, BLOOD  POC URINE PREG, ED  POCT PREGNANCY, URINE   ____________________________________________  FINAL ASSESSMENT AND PLAN  Cholelithiasis  Plan: Patient with labs and imaging as dictated above. Patient's in no distress, labs are reassuring as well as her examination. She does not need repeat ultrasound this time. I discussed with her general surgeon who is comfortable with her continuing outpatient follow-up.   Emily Filbert, MD   Note: This dictation was prepared with Dragon dictation. Any transcriptional errors that result from this process are unintentional    Emily Filbert, MD 05/10/16 [phone]

## 2016-05-10 NOTE — Telephone Encounter (Signed)
Sherry Malone--patient's SGO advised of pre op date/time and sx date. Sx: 05/23/16 with Dr Dow AdolphLoflin--Laparoscopic cholecystectomy.  Pre op: 05/16/16 between 9-1:00pm--Phone.   Patient made aware to call [phone], between 1-3:00pm the day before surgery, to find out what time to arrive.

## 2016-05-11 ENCOUNTER — Telehealth: Payer: Self-pay | Admitting: Surgery

## 2016-05-11 NOTE — Telephone Encounter (Signed)
I spoke with Dr. Orvis BrillLoflin and Dr. Everlene FarrierPabon in regards to this. Dr. Everlene FarrierPabon will do patient[facility] surgery [facility].  Please change surgery date, surgeon, and pre-admit interview for patient. Please call patient with details of surgery change.

## 2016-05-11 NOTE — Telephone Encounter (Signed)
Spoke with patient at this time. She does want to move the surgery up despite it being with another surgeon if it is moved. She states that her pain is continuing daily and she would like this done as soon as possible.

## 2016-05-11 NOTE — Telephone Encounter (Signed)
Patient has called and stated that she was seen in the ED at [facility] on 05/10/16. I have reviewed all documentation and labs from the ED visit. Documentation states for patient to continue with planned surgery as an outpatient service and labs look good. I have advised patient of this informations, however she states that she still continues with abdominal pain and nausea. I have advised her to take her prescribed nausea medication as instructed at discharge and that I would have a nurse call her back to discuss her pain. She would like to move her surgery date up to be done at a earlier date. At this time her surgery is scheduled for 05/23/16 when Dr Orvis BrillLoflin is on Days. Please contact patient with concerns about her pain. [phone].

## 2016-05-11 NOTE — Telephone Encounter (Signed)
Surgery date has been changed and all directions have been given to the patient.  Pt advised of pre op date/time and sx date. Sx: 05/15/16 with Dr Pabon--Laparoscopic cholecystectomy with gram. Pre op: Patient will arrive  [facility] the day of surgery.  Patient advised of nothing to eat or drink after midnight the night before surgery. Also hold all medications.

## 2016-05-14 MED ORDER — DEXTROSE 5 % IV SOLN
2.0000 g | INTRAVENOUS | Status: AC
  Administered 2016-05-15: 2 g via INTRAVENOUS
  Filled 2016-05-14: qty 2

## 2016-05-15 ENCOUNTER — Ambulatory Visit
Admission: RE | Admit: 2016-05-15 | Discharge: 2016-05-15 | Disposition: A | Discharge status: Home / Self Care | Payer: PRIVATE HEALTH INSURANCE | Source: Ambulatory Visit | Attending: Surgery | Admitting: Surgery

## 2016-05-15 ENCOUNTER — Ambulatory Visit: Payer: PRIVATE HEALTH INSURANCE | Admitting: Anesthesiology

## 2016-05-15 ENCOUNTER — Encounter
Admission: RE | Disposition: A | Discharge status: Home / Self Care | Payer: Self-pay | Source: Ambulatory Visit | Attending: Surgery

## 2016-05-15 DIAGNOSIS — J45909 Unspecified asthma, uncomplicated: Secondary | ICD-10-CM | POA: Diagnosis not present

## 2016-05-15 DIAGNOSIS — Z833 Family history of diabetes mellitus: Secondary | ICD-10-CM | POA: Diagnosis not present

## 2016-05-15 DIAGNOSIS — Z8249 Family history of ischemic heart disease and other diseases of the circulatory system: Secondary | ICD-10-CM | POA: Diagnosis not present

## 2016-05-15 DIAGNOSIS — F17210 Nicotine dependence, cigarettes, uncomplicated: Secondary | ICD-10-CM | POA: Insufficient documentation

## 2016-05-15 DIAGNOSIS — Z8379 Family history of other diseases of the digestive system: Secondary | ICD-10-CM | POA: Diagnosis not present

## 2016-05-15 DIAGNOSIS — Z841 Family history of disorders of kidney and ureter: Secondary | ICD-10-CM | POA: Insufficient documentation

## 2016-05-15 DIAGNOSIS — K8020 Calculus of gallbladder without cholecystitis without obstruction: Secondary | ICD-10-CM | POA: Insufficient documentation

## 2016-05-15 HISTORY — PX: CHOLECYSTECTOMY: SHX55

## 2016-05-15 LAB — POCT PREGNANCY, URINE: Preg Test, Ur: NEGATIVE

## 2016-05-15 SURGERY — LAPAROSCOPIC CHOLECYSTECTOMY WITH INTRAOPERATIVE CHOLANGIOGRAM
Anesthesia: General | Site: Abdomen | Wound class: Clean Contaminated

## 2016-05-15 MED ORDER — ONDANSETRON HCL 4 MG/2ML IJ SOLN
INTRAMUSCULAR | Status: DC | PRN
  Administered 2016-05-15: 4 mg via INTRAVENOUS

## 2016-05-15 MED ORDER — SODIUM CHLORIDE 0.9 % IJ SOLN
INTRAMUSCULAR | Status: AC
  Filled 2016-05-15: qty 10

## 2016-05-15 MED ORDER — ROCURONIUM BROMIDE 100 MG/10ML IV SOLN
INTRAVENOUS | Status: DC | PRN
  Administered 2016-05-15: 20 mg via INTRAVENOUS
  Administered 2016-05-15: 25 mg via INTRAVENOUS
  Administered 2016-05-15: 5 mg via INTRAVENOUS

## 2016-05-15 MED ORDER — CHLORHEXIDINE GLUCONATE CLOTH 2 % EX PADS
6.0000 | MEDICATED_PAD | Freq: Once | CUTANEOUS | Status: AC
  Administered 2016-05-15: 6 via TOPICAL

## 2016-05-15 MED ORDER — ACETAMINOPHEN 10 MG/ML IV SOLN
INTRAVENOUS | Status: DC | PRN
  Administered 2016-05-15: 1000 mg via INTRAVENOUS

## 2016-05-15 MED ORDER — SUGAMMADEX SODIUM 500 MG/5ML IV SOLN
INTRAVENOUS | Status: DC | PRN
  Administered 2016-05-15: 290 mg via INTRAVENOUS

## 2016-05-15 MED ORDER — MIDAZOLAM HCL 2 MG/2ML IJ SOLN
2.0000 mg | Freq: Once | INTRAMUSCULAR | Status: AC
  Administered 2016-05-15: 2 mg via INTRAVENOUS

## 2016-05-15 MED ORDER — HYDROMORPHONE HCL 1 MG/ML IJ SOLN
INTRAMUSCULAR | Status: DC | PRN
  Administered 2016-05-15: .2 mg via INTRAVENOUS
  Administered 2016-05-15: .3 mg via INTRAVENOUS
  Administered 2016-05-15: .5 mg via INTRAVENOUS

## 2016-05-15 MED ORDER — ONDANSETRON HCL 4 MG/2ML IJ SOLN
INTRAMUSCULAR | Status: AC
  Filled 2016-05-15: qty 2

## 2016-05-15 MED ORDER — ONDANSETRON HCL 4 MG/2ML IJ SOLN
4.0000 mg | Freq: Once | INTRAMUSCULAR | Status: AC | PRN
  Administered 2016-05-15: 4 mg via INTRAVENOUS

## 2016-05-15 MED ORDER — PROMETHAZINE HCL 25 MG/ML IJ SOLN
INTRAMUSCULAR | Status: AC
  Filled 2016-05-15: qty 1

## 2016-05-15 MED ORDER — MIDAZOLAM HCL 2 MG/2ML IJ SOLN
INTRAMUSCULAR | Status: DC | PRN
  Administered 2016-05-15: 2 mg via INTRAVENOUS

## 2016-05-15 MED ORDER — PROMETHAZINE HCL 25 MG/ML IJ SOLN
6.2500 mg | INTRAMUSCULAR | Status: AC
  Administered 2016-05-15: 6.25 mg via INTRAVENOUS

## 2016-05-15 MED ORDER — FENTANYL CITRATE (PF) 100 MCG/2ML IJ SOLN
25.0000 ug | INTRAMUSCULAR | Status: DC | PRN
  Administered 2016-05-15 (×3): 25 ug via INTRAVENOUS

## 2016-05-15 MED ORDER — DEXAMETHASONE SODIUM PHOSPHATE 10 MG/ML IJ SOLN
INTRAMUSCULAR | Status: DC | PRN
  Administered 2016-05-15: 10 mg via INTRAVENOUS

## 2016-05-15 MED ORDER — BUPIVACAINE-EPINEPHRINE 0.25% -1:200000 IJ SOLN
INTRAMUSCULAR | Status: DC | PRN
  Administered 2016-05-15: 30 mL

## 2016-05-15 MED ORDER — SODIUM CHLORIDE 0.9 % IJ SOLN
INTRAMUSCULAR | Status: AC
  Filled 2016-05-15: qty 50

## 2016-05-15 MED ORDER — PHENYLEPHRINE HCL 10 MG/ML IJ SOLN
INTRAMUSCULAR | Status: DC | PRN
  Administered 2016-05-15: 100 ug via INTRAVENOUS

## 2016-05-15 MED ORDER — MIDAZOLAM HCL 2 MG/2ML IJ SOLN
INTRAMUSCULAR | Status: AC
  Administered 2016-05-15: 2 mg via INTRAVENOUS
  Filled 2016-05-15: qty 2

## 2016-05-15 MED ORDER — ACETAMINOPHEN 10 MG/ML IV SOLN
INTRAVENOUS | Status: AC
  Filled 2016-05-15: qty 100

## 2016-05-15 MED ORDER — OXYCODONE-ACETAMINOPHEN 7.5-325 MG PO TABS: 2.0000 | ORAL_TABLET | ORAL | 0 refills | Status: DC | PRN

## 2016-05-15 MED ORDER — LACTATED RINGERS IV SOLN
INTRAVENOUS | Status: DC
  Administered 2016-05-15 (×2): via INTRAVENOUS

## 2016-05-15 MED ORDER — BUPIVACAINE-EPINEPHRINE (PF) 0.25% -1:200000 IJ SOLN
INTRAMUSCULAR | Status: AC
  Filled 2016-05-15: qty 30

## 2016-05-15 MED ORDER — FENTANYL CITRATE (PF) 100 MCG/2ML IJ SOLN
INTRAMUSCULAR | Status: DC | PRN
  Administered 2016-05-15 (×2): 50 ug via INTRAVENOUS

## 2016-05-15 MED ORDER — FENTANYL CITRATE (PF) 100 MCG/2ML IJ SOLN
INTRAMUSCULAR | Status: AC
  Filled 2016-05-15: qty 2

## 2016-05-15 SURGICAL SUPPLY — 46 items
APPLICATOR COTTON TIP 6IN STRL (MISCELLANEOUS) IMPLANT
APPLIER CLIP 5 13 M/L LIGAMAX5 (MISCELLANEOUS) ×2
BLADE SURG 15 STRL LF DISP TIS (BLADE) ×1 IMPLANT
BLADE SURG 15 STRL SS (BLADE) ×1
CANISTER SUCT 1200ML W/VALVE (MISCELLANEOUS) ×2 IMPLANT
CHLORAPREP W/TINT 26ML (MISCELLANEOUS) ×2 IMPLANT
CHOLANGIOGRAM CATH TAUT (CATHETERS) IMPLANT
CLEANER CAUTERY TIP 5X5 PAD (MISCELLANEOUS) ×1 IMPLANT
CLIP APPLIE 5 13 M/L LIGAMAX5 (MISCELLANEOUS) ×1 IMPLANT
DECANTER SPIKE VIAL GLASS SM (MISCELLANEOUS) ×2 IMPLANT
DEVICE TROCAR PUNCTURE CLOSURE (ENDOMECHANICALS) IMPLANT
DRAPE C-ARM XRAY 36X54 (DRAPES) IMPLANT
ELECT REM PT RETURN 9FT ADLT (ELECTROSURGICAL) ×2
ELECTRODE REM PT RTRN 9FT ADLT (ELECTROSURGICAL) ×1 IMPLANT
ENDOPOUCH RETRIEVER 10 (MISCELLANEOUS) ×2 IMPLANT
GLOVE BIO SURGEON STRL SZ7 (GLOVE) ×4 IMPLANT
GLOVE BIO SURGEON STRL SZ7.5 (GLOVE) ×2 IMPLANT
GOWN STRL REUS W/ TWL LRG LVL3 (GOWN DISPOSABLE) ×3 IMPLANT
GOWN STRL REUS W/TWL LRG LVL3 (GOWN DISPOSABLE) ×3
IRRIGATION STRYKERFLOW (MISCELLANEOUS) ×1 IMPLANT
IRRIGATOR STRYKERFLOW (MISCELLANEOUS) ×2
IV CATH ANGIO 12GX3 LT BLUE (NEEDLE) IMPLANT
IV NS 1000ML (IV SOLUTION) ×1
IV NS 1000ML BAXH (IV SOLUTION) ×1 IMPLANT
L-HOOK LAP DISP 36CM (ELECTROSURGICAL)
LHOOK LAP DISP 36CM (ELECTROSURGICAL) IMPLANT
LIQUID BAND (GAUZE/BANDAGES/DRESSINGS) ×2 IMPLANT
NEEDLE HYPO 22GX1.5 SAFETY (NEEDLE) ×2 IMPLANT
PACK LAP CHOLECYSTECTOMY (MISCELLANEOUS) ×2 IMPLANT
PAD CLEANER CAUTERY TIP 5X5 (MISCELLANEOUS) ×1
PENCIL ELECTRO HAND CTR (MISCELLANEOUS) ×2 IMPLANT
SCISSORS METZENBAUM CVD 33 (INSTRUMENTS) ×2 IMPLANT
SLEEVE ENDOPATH XCEL 5M (ENDOMECHANICALS) ×4 IMPLANT
SOL ANTI-FOG 6CC FOG-OUT (MISCELLANEOUS) IMPLANT
SOL FOG-OUT ANTI-FOG 6CC (MISCELLANEOUS)
SPONGE LAP 18X18 5 PK (GAUZE/BANDAGES/DRESSINGS) ×2 IMPLANT
STOPCOCK 3 WAY  REPLAC (MISCELLANEOUS) IMPLANT
SUT ETHIBOND 0 MO6 C/R (SUTURE) ×2 IMPLANT
SUT MNCRL AB 4-0 PS2 18 (SUTURE) ×2 IMPLANT
SUT VIC AB 0 CT2 27 (SUTURE) IMPLANT
SUT VICRYL 0 AB UR-6 (SUTURE) ×4 IMPLANT
SYR 20CC LL (SYRINGE) ×2 IMPLANT
TROCAR XCEL BLUNT TIP 100MML (ENDOMECHANICALS) ×2 IMPLANT
TROCAR XCEL NON-BLD 5MMX100MML (ENDOMECHANICALS) ×2 IMPLANT
TUBING INSUFFLATOR HI FLOW (MISCELLANEOUS) ×2 IMPLANT
WATER STERILE IRR 1000ML POUR (IV SOLUTION) ×2 IMPLANT

## 2016-05-15 NOTE — Progress Notes (Signed)
Report to Melanie RN

## 2016-05-15 NOTE — Interval H&P Note (Signed)
History and Physical Interval Note:  05/15/2016 2:53 PM  Sherry MusterAlicia P Alonso-Contreras  has presented today for surgery, with the diagnosis of Symptomatic cholelithasis  The various methods of treatment have been discussed with the patient and family. After consideration of risks, benefits and other options for treatment, the patient has consented to  Procedure(s): LAPAROSCOPIC CHOLECYSTECTOMY WITH INTRAOPERATIVE CHOLANGIOGRAM (N/A) as a surgical intervention .  The patient's history has been reviewed, patient examined, no change in status, stable for surgery.  I have reviewed the patient's chart and labs.  Questions were answered to the patient's satisfaction.     [name] F [name]

## 2016-05-15 NOTE — Op Note (Signed)
Laparoscopic Cholecystectomy  Pre-operative Diagnosis: Symptomatic cholelithiasis  Post-operative Diagnosis: Same  Procedure: Laparoscopic cholecystectomy  Surgeon: Sterling Bigiego Pabon, MD FACS  Anesthesia: Gen. with endotracheal tube  Findings: Gallstones   Estimated Blood Loss: 10cc         Specimens: Gallbladder           Complications: none   Procedure Details  The patient was seen again in the Holding Room. The benefits, complications, treatment options, and expected outcomes were discussed with the patient. The risks of bleeding, infection, recurrence of symptoms, failure to resolve symptoms, bile duct damage, bile duct leak, retained common bile duct stone, bowel injury, any of which could require further surgery and/or ERCP, stent, or papillotomy were reviewed with the patient. The likelihood of improving the patient's symptoms with return to their baseline status is good.  The patient and/or family concurred with the proposed plan, giving informed consent.  The patient was taken to Operating Room, identified as Sherry Malone and the procedure verified as Laparoscopic Cholecystectomy.  A Time Out was held and the above information confirmed.  Prior to the induction of general anesthesia, antibiotic prophylaxis was administered. VTE prophylaxis was in place. General endotracheal anesthesia was then administered and tolerated well. After the induction, the abdomen was prepped with Chloraprep and draped in the sterile fashion. The patient was positioned in the supine position.  Local anesthetic  was injected into the skin near the umbilicus and an incision made. Cut down technique was used to enter the abdominal cavity and a Hasson trochar was placed after two vicryl stitches were anchored to the fascia. Pneumoperitoneum was then created with CO2 and tolerated well without any adverse changes in the patient's vital signs.  Three 5-mm ports were placed in the right upper quadrant  all under direct vision. All skin incisions  were infiltrated with a local anesthetic agent before making the incision and placing the trocars.   The patient was positioned  in reverse Trendelenburg, tilted slightly to the patient's left.  The gallbladder was identified, the fundus grasped and retracted cephalad. Adhesions were lysed bluntly. The infundibulum was grasped and retracted laterally, exposing the peritoneum overlying the triangle of Calot. This was then divided and exposed in a blunt fashion. An extended critical view of the cystic duct and cystic artery was obtained.  The cystic duct was clearly identified and bluntly dissected.   Artery and duct were double clipped and divided.  The gallbladder was taken from the gallbladder fossa in a retrograde fashion with the electrocautery. The gallbladder was removed and placed in an Endocatch bag. The liver bed was irrigated and inspected. Hemostasis was achieved with the electrocautery. Copious irrigation was utilized and was repeatedly aspirated until clear.  The gallbladder and Endocatch sac were then removed through the epigastric port site.    Inspection of the right upper quadrant was performed. No bleeding, bile duct injury or leak, or bowel injury was noted. Pneumoperitoneum was released.  The periumbilical port site was closed with figure-of-eight 0 Vicryl sutures. 4-0 subcuticular Monocryl was used to close the skin. Dermabond was  applied.  The patient was then extubated and brought to the recovery room in stable condition. Sponge, lap, and needle counts were correct at closure and at the conclusion of the case.               Sterling Bigiego Pabon, MD, FACS

## 2016-05-15 NOTE — Progress Notes (Signed)
Dr. Everlene FarrierPabon still in OR, updated patient.   Patient calm And no acute distress.  Friend in with pt.  Pt. Verbalizes Understanding.  Asked for her glasses back to Be able to read.

## 2016-05-15 NOTE — H&P (View-Only) (Signed)
Subjective:     Patient ID: Sherry Malone, female   DOB: 1996/06/29, 20 y.o.   MRN: [id number]  HPI  20 year old female who underwent a healthy pregnancy and delivery back in March. The patient states that off and on over the past few weeks she's been having epigastric abdominal pain. The patient states that she has been having some nausea but no vomiting. The patient had been treated with a PPI throughout her pregnancy but states that this is not helping. Patient has had no fever however she does state that the last time she got the abdominal pain started to get some chills with it. Patient has had some diarrhea after eating some fatty foods and having this pain as well. Patient states that her appetite has been decreased over the past few weeks which is having this pain. Patient denies any fever, chills, malaise, vomiting, and constipation or dysuria.  Past Medical History:  Diagnosis Date  . Asthma   . Medical history non-contributory    Past Surgical History:  Procedure Laterality Date  . NO PAST SURGERIES     Family History  Problem Relation Age of Onset  . Hypertension Father   . Diabetes Maternal Grandmother   . Kidney disease Maternal Grandmother   . Gallstones Other    Social History   Social History  . Marital status: Single    Spouse name: N/A  . Number of children: N/A  . Years of education: N/A   Social History Main Topics  . Smoking status: Current Every Day Smoker    Packs/day: 0.25    Types: Cigarettes    Last attempt to quit: 03/05/2015  . Smokeless tobacco: Never Used  . Alcohol use Yes     Comment: rarely  . Drug use:     Frequency: 9.0 times per week    Types: Marijuana, Other-see comments  . Sexual activity: Yes   Other Topics Concern  . None   Social History Narrative  . None    Current Outpatient Prescriptions:  .  albuterol (PROVENTIL HFA;VENTOLIN HFA) 108 (90 Base) MCG/ACT inhaler, Inhale into the lungs., Disp: , Rfl:  .  fluticasone  (FLONASE) 50 MCG/ACT nasal spray, Place into the nose., Disp: , Rfl:  .  HYDROcodone-acetaminophen (NORCO) 5-325 MG tablet, Take 1 tablet by mouth every 6 (six) hours as needed for severe pain., Disp: 12 tablet, Rfl: 0 .  ondansetron (ZOFRAN) 4 MG tablet, Take 1 tablet (4 mg total) by mouth every 8 (eight) hours as needed for nausea or vomiting., Disp: 21 tablet, Rfl: 0 No Known Allergies  Review of Systems  Constitutional: Positive for appetite change, chills and fatigue. Negative for activity change, diaphoresis, fever and unexpected weight change.  HENT: Negative for congestion and sore throat.   Respiratory: Negative for cough, chest tightness and shortness of breath.   Cardiovascular: Negative for chest pain, palpitations and leg swelling.  Gastrointestinal: Positive for abdominal distention, abdominal pain, diarrhea and nausea. Negative for blood in stool, constipation and vomiting.  Genitourinary: Negative for dysuria and hematuria.  Musculoskeletal: Negative for back pain, joint swelling and neck pain.  Skin: Negative for color change, pallor, rash and wound.  Neurological: Negative for dizziness and weakness.  Hematological: Negative for adenopathy. Does not bruise/bleed easily.  Psychiatric/Behavioral: Negative for agitation. The patient is not nervous/anxious.   All other systems reviewed and are negative.      Vitals:   05/09/16 1004  BP: 124/67  Pulse: 95  Temp: 98.5 F (  36.9 C)    Objective:   Physical Exam  Constitutional: She is oriented to person, place, and time. She appears well-developed and well-nourished. No distress.  HENT:  Head: Normocephalic and atraumatic.  Right Ear: External ear normal.  Left Ear: External ear normal.  Nose: Nose normal.  Mouth/Throat: Oropharynx is clear and moist. No oropharyngeal exudate.  Eyes: Conjunctivae and EOM are normal. Pupils are equal, round, and reactive to light. No scleral icterus.  Neck: Normal range of motion. Neck  supple. No tracheal deviation present.  Cardiovascular: Normal rate, regular rhythm, normal heart sounds and intact distal pulses.  Exam reveals no gallop and no friction rub.   No murmur heard. Pulmonary/Chest: Effort normal and breath sounds normal. No respiratory distress. She has no wheezes. She has no rales. She exhibits no tenderness.  Abdominal: Soft. Bowel sounds are normal. She exhibits no distension. There is tenderness. There is no rebound and no guarding.  Epigastric and RUQ tenderness to deep palpation   Musculoskeletal: Normal range of motion. She exhibits no edema, tenderness or deformity.  Neurological: She is oriented to person, place, and time. No cranial nerve deficit.  Skin: Skin is warm and dry. No rash noted. No erythema. No pallor.  Psychiatric: She has a normal mood and affect. Her behavior is normal. Judgment and thought content normal.  Vitals reviewed.      CBC Latest Ref Rng & Units 05/06/2016 10/27/2015 10/27/2015  WBC 3.6 - 11.0 K/uL 9.7 9.3 12.1(H)  Hemoglobin 12.0 - 16.0 g/dL 16.114.0 0.9(U8.6(L) 0.4(V9.1(L)  Hematocrit 35.0 - 47.0 % 40.9 25.2(L) 26.3(L)  Platelets 150 - 440 K/uL 171 125(L) 146(L)   CMP Latest Ref Rng & Units 05/06/2016 10/25/2015 04/05/2015  Glucose 65 - 99 mg/dL 409(W100(H) 119(J101(H) 478(G112(H)  BUN 6 - 20 mg/dL 12 9 9   Creatinine 0.44 - 1.00 mg/dL 9.560.83 2.130.62 0.860.66  Sodium 135 - 145 mmol/L 140 138 133(L)  Potassium 3.5 - 5.1 mmol/L 3.8 3.9 3.3(L)  Chloride 101 - 111 mmol/L 111 109 102  CO2 22 - 32 mmol/L 24 20(L) 20(L)  Calcium 8.9 - 10.3 mg/dL 8.9 5.7(Q8.6(L) 9.1  Total Protein 6.5 - 8.1 g/dL 7.3 6.5 7.6  Total Bilirubin 0.3 - 1.2 mg/dL 0.6 0.5 0.5  Alkaline Phos 38 - 126 U/L 58 184(H) 38  AST 15 - 41 U/L 83(H) 16 28  ALT 14 - 54 U/L 44 7(L) 21   U/S: IMPRESSION: Cholelithiasis without evidence of acute cholecystitis. No biliary dilatation. Assessment:     20 yr old female with symptomatic cholelithiasis     Plan:     I have personally reviewed the patient's  past medical history which only includes asthma and healthy pregnancy. Have also personally reviewed her emergency room visits from August of last year March and a few days ago. I have personally reviewed her laboratory values which show a normal LFTs and alkaline phosphatase as well as normal bilirubin. I have also personally reviewed her ultrasound images that she so multiple gallstones but no inflammation wall thickening fluid or dilatation of the ducts. Also reviewed the radiology read as above.  The risks, benefits, complications, treatment options, and expected outcomes were discussed with the patient. The possibilities of bleeding, recurrent infection, finding a normal gallbladder, perforation of viscus organs, damage to surrounding structures, bile leak, abscess formation, needing a drain placed, the need for additional procedures, reaction to medication, pulmonary aspiration,  failure to diagnose a condition, the possible need to convert to an open procedure,  and creating a complication requiring transfusion or operation were discussed with the patient. The patient and/or family concurred with the proposed plan, giving informed consent. We will schedule her for Laparoscopic cholecystectomy in the first week in October.

## 2016-05-15 NOTE — Anesthesia Preprocedure Evaluation (Signed)
Anesthesia Evaluation  Patient identified by MRN, date of birth, ID band Patient awake    Reviewed: Allergy & Precautions, H&P , NPO status , Patient's Chart, lab work & pertinent test results, reviewed documented beta blocker date and time   Airway Mallampati: II  TM Distance: >3 FB Neck ROM: full    Dental  (+) Teeth Intact   Pulmonary neg pulmonary ROS, asthma , former smoker,    Pulmonary exam normal        Cardiovascular negative cardio ROS Normal cardiovascular exam Rhythm:regular Rate:Normal     Neuro/Psych negative neurological ROS  negative psych ROS   GI/Hepatic negative GI ROS, Neg liver ROS,   Endo/Other  negative endocrine ROS  Renal/GU negative Renal ROS  negative genitourinary   Musculoskeletal   Abdominal   Peds  Hematology negative hematology ROS (+)   Anesthesia Other Findings Past Medical History: No date: Asthma No date: Medical history non-contributory Past Surgical History: No date: NO PAST SURGERIES BMI    Body Mass Index:  30.23 kg/m     Reproductive/Obstetrics negative OB ROS                             Anesthesia Physical Anesthesia Plan  ASA: II  Anesthesia Plan: General ETT   Post-op Pain Management:    Induction: Intravenous  Airway Management Planned:   Additional Equipment:   Intra-op Plan:   Post-operative Plan:   Informed Consent: I have reviewed the patients History and Physical, chart, labs and discussed the procedure including the risks, benefits and alternatives for the proposed anesthesia with the patient or authorized representative who has indicated his/her understanding and acceptance.   Dental Advisory Given  Plan Discussed with: CRNA  Anesthesia Plan Comments: (Very nervous and will provide preop sedation to assist. JA)        Anesthesia Quick Evaluation

## 2016-05-15 NOTE — Discharge Instructions (Signed)
Laparoscopic Cholecystectomy, Care After Refer to this sheet in the next few weeks. These instructions provide you with information about caring for yourself after your procedure. Your health care provider may also give you more specific instructions. Your treatment has been planned according to current medical practices, but problems sometimes occur. Call your health care provider if you have any problems or questions after your procedure. WHAT TO EXPECT AFTER THE PROCEDURE After your procedure, it is common to have:  Pain at your incision sites. You will be given pain medicines to control your pain.  Mild nausea or vomiting. This should improve after the first 24 hours.  Bloating and possible shoulder pain from the gas that was used during the procedure. This will improve after the first 24 hours. HOME CARE INSTRUCTIONS Incision Care  Follow instructions from your health care provider about how to take care of your incisions. Make sure you:  Wash your hands with soap and water before you change your bandage (dressing). If soap and water are not available, use hand sanitizer.  Leave skin glue in place.   Do not take baths, swim, or use a hot tub until your health care provider approves. Ask your health care provider if you can take showers. You may only be allowed to take sponge baths for bathing. General Instructions  Take over-the-counter and prescription medicines only as told by your health care provider.  Do not drive or operate heavy machinery while taking prescription pain medicine.  Return to your normal diet as told by your health care provider.  Do not lift anything that is heavier than 10 lb (4.5 kg).  Do not play contact sports for one week or until your health care provider approves. SEEK MEDICAL CARE IF:   You have redness, swelling, or pain at the site of your incision.  You have fluid, blood, or pus coming from your incision.  You notice a bad smell coming from  your incision area.  Your surgical incisions break open.  You have a fever. SEEK IMMEDIATE MEDICAL CARE IF:  You develop a rash.  You have difficulty breathing.  You have chest pain.  You have increasing pain in your shoulders (shoulder strap areas).  You faint or have dizzy episodes while you are standing.  You have severe pain in your abdomen.  You have nausea or vomiting that lasts for more than one day.   This information is not intended to replace advice given to you by your health care provider. Make sure you discuss any questions you have with your health care provider.   Document Released: 08/07/2005 Document Revised: 04/28/2015 Document Reviewed: 03/19/2013 Elsevier Interactive Patient Education 2016 [employer]  General Anesthesia, Adult, Care After Refer to this sheet in the next few weeks. These instructions provide you with information on caring for yourself after your procedure. Your health care provider may also give you more specific instructions. Your treatment has been planned according to current medical practices, but problems sometimes occur. Call your health care provider if you have any problems or questions after your procedure. WHAT TO EXPECT AFTER THE PROCEDURE After the procedure, it is typical to experience:  Sleepiness.  Nausea and vomiting. HOME CARE INSTRUCTIONS  For the first 24 hours after general anesthesia:  Have a responsible person with you.  Do not drive a car. If you are alone, do not take public transportation.  Do not drink alcohol.  Do not take medicine that has not been prescribed by your health  care provider.  Do not sign important papers or make important decisions.  You may resume a normal diet and activities as directed by your health care provider.  Change bandages (dressings) as directed.  If you have questions or problems that seem related to general anesthesia, call the hospital and ask for the anesthetist or  anesthesiologist on call. SEEK MEDICAL CARE IF:  You have nausea and vomiting that continue the day after anesthesia.  You develop a rash. SEEK IMMEDIATE MEDICAL CARE IF:   You have difficulty breathing.  You have chest pain.  You have any allergic problems.   This information is not intended to replace advice given to you by your health care provider. Make sure you discuss any questions you have with your health care provider.   Document Released: 11/13/2000 Document Revised: 08/28/2014 Document Reviewed: 12/06/2011 Elsevier Interactive Patient Education [employer]

## 2016-05-15 NOTE — Transfer of Care (Signed)
Immediate Anesthesia Transfer of Care Note  Patient: Sherry Malone  Procedure(s) Performed: Procedure(s): LAPAROSCOPIC CHOLECYSTECTOMY (N/A)  Patient Location: PACU  Anesthesia Type:General  Level of Consciousness: awake, alert  and oriented  Airway & Oxygen Therapy: Patient connected to face mask oxygen  Post-op Assessment: Post -op Vital signs reviewed and stable  Post vital signs: stable  Last Vitals:  Vitals:   05/15/16 1043 05/15/16 1652  BP: 133/73 132/67  Pulse:  (!) 105  Resp: 16 (!) 21  Temp: 37 C 36.4 C    Last Pain:  Vitals:   05/15/16 1652  TempSrc: Temporal         Complications: No apparent anesthesia complications

## 2016-05-15 NOTE — Anesthesia Procedure Notes (Signed)
Procedure Name: Intubation Date/Time: 05/15/2016 3:38 PM Performed by: Derinda LateIACONE, Rosea Dory Pre-anesthesia Checklist: Timeout performed, Patient being monitored, Suction available, Emergency Drugs available and Patient identified Patient Re-evaluated:Patient Re-evaluated prior to inductionOxygen Delivery Method: Circle system utilized Preoxygenation: Pre-oxygenation with 100% oxygen Intubation Type: IV induction Ventilation: Mask ventilation without difficulty Laryngoscope Size: Mac and 3 Grade View: Grade I Tube type: Oral Tube size: 7.0 mm Number of attempts: 1 Airway Equipment and Method: Stylet Placement Confirmation: ETT inserted through vocal cords under direct vision,  positive ETCO2 and breath sounds checked- equal and bilateral Secured at: 20.5 cm Tube secured with: Tape Dental Injury: Teeth and Oropharynx as per pre-operative assessment

## 2016-05-16 ENCOUNTER — Other Ambulatory Visit: Payer: PRIVATE HEALTH INSURANCE

## 2016-05-16 ENCOUNTER — Encounter: Payer: Self-pay | Admitting: Surgery

## 2016-05-16 NOTE — Anesthesia Postprocedure Evaluation (Signed)
Anesthesia Post Note  Patient: Sherry Malone  Procedure(s) Performed: Procedure(s) (LRB): LAPAROSCOPIC CHOLECYSTECTOMY (N/A)  Patient location during evaluation: PACU Anesthesia Type: General Level of consciousness: awake and alert Pain management: pain level controlled Vital Signs Assessment: post-procedure vital signs reviewed and stable Respiratory status: spontaneous breathing, nonlabored ventilation, respiratory function stable and patient connected to nasal cannula oxygen Cardiovascular status: blood pressure returned to baseline and stable Postop Assessment: no signs of nausea or vomiting Anesthetic complications: no    Last Vitals:  Vitals:   05/15/16 1837 05/15/16 1907  BP: 113/76 115/78  Pulse: 78 89  Resp: 19 18  Temp:      Last Pain:  Vitals:   05/16/16 0836  TempSrc:   PainSc: 1                  Lenard SimmerAndrew Chanteria Haggard

## 2016-05-17 LAB — SURGICAL PATHOLOGY

## 2016-05-30 ENCOUNTER — Encounter: Payer: Self-pay | Admitting: Surgery

## 2016-05-30 ENCOUNTER — Ambulatory Visit (INDEPENDENT_AMBULATORY_CARE_PROVIDER_SITE_OTHER): Payer: PRIVATE HEALTH INSURANCE | Admitting: Surgery

## 2016-05-30 VITALS — BP 120/66 | HR 82 | Temp 98.5°F | Ht 61.0 in | Wt 157.0 lb

## 2016-05-30 DIAGNOSIS — K8020 Calculus of gallbladder without cholecystitis without obstruction: Secondary | ICD-10-CM

## 2016-05-30 NOTE — Patient Instructions (Signed)
Please continue your Omeprazole for your acid reflux.   Please call our office with any questions or concerns.  Please do not submerge in a tub, hot tub, or pool until incisions are completely sealed.  Use sun block to incision area over the next year if this area will be exposed to sun. This helps decrease scarring.  You may now resume your normal activities. Listen to your body when lifting, if you have pain when lifting, stop and then try again in a few days.  If you develop redness, drainage, or pain at incision sites- call our office immediately and speak with a nurse.

## 2016-05-30 NOTE — Progress Notes (Signed)
05/30/2016  HPI: Patient is status post laparoscopic cholecystectomy on 05/15/2016 with Dr. Everlene FarrierPabon. She presents today for her postop visit. She reports that her pain on the right side has been improving but occasionally gets pain on the right and left sides with deep inspiration. However this is not the same kind of pain that she was having before surgery. She was told that she could also have gastric ulcers and has been taking omeprazole once daily since the day of her surgery. Otherwise no fevers no chills, no chest pain, no shortness of breath, no nausea, no vomiting.  Vital signs: BP 120/66   Pulse 82   Temp 98.5 F (36.9 C) (Oral)   Ht 5\' 1"  (1.549 m)   Wt 71.2 kg (157 lb)   LMP 05/01/2016 (Exact Date)   BMI 29.66 kg/m    Physical Exam: Constitutional: No acute distress Abdomen: Soft, nondistended, nontender to palpation. Wounds are clean dry and intact with no evidence of infection.  Assessment/Plan: This is a 20 year old female status post laparoscopic cholecystectomy. The pathology report was discussed with the patient and is benign with no malignancy.  I reassured the patient that some of the pain may be postoperative still and will continue to improve as inflammation dissipates and the healing process continues. She may start taking ibuprofen 600 mg 3 times a day as needed for pain and she'll continue taking her omeprazole as well. As long as her symptoms continued to improve she may follow-up with us on an as-needed basis. Patient understands the plan and all her questions have been answered.   Howie IllJose Luis Genee Rann, MD [facility]

## 2016-07-12 IMAGING — US US MFM OB DETAIL+14 WK
1 series · 14 of 28 positions shown · non-contrast
Comparison: none

[Series 1: us mfm ob detail+14 wk · 0.25mm/px · 14 of 69 slices shown]
[im 3/69]
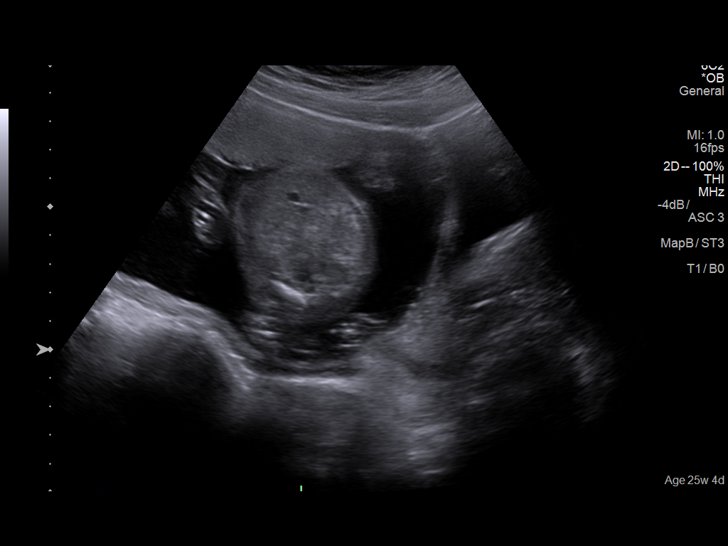
[im 8/69]
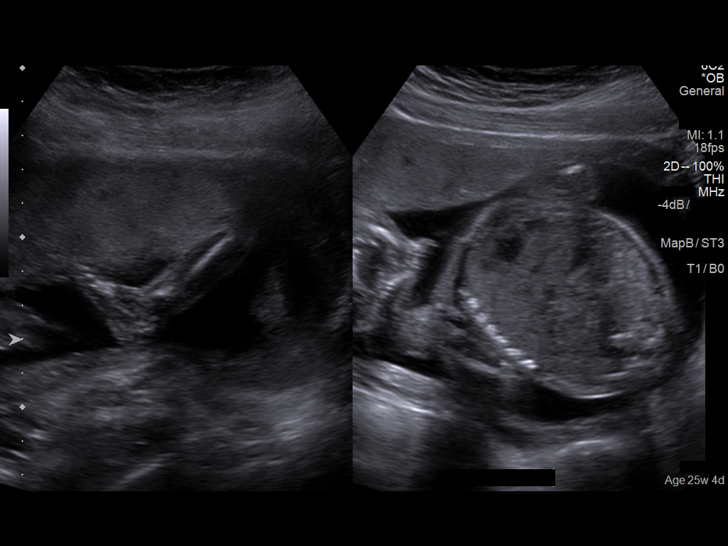
[im 13/69]
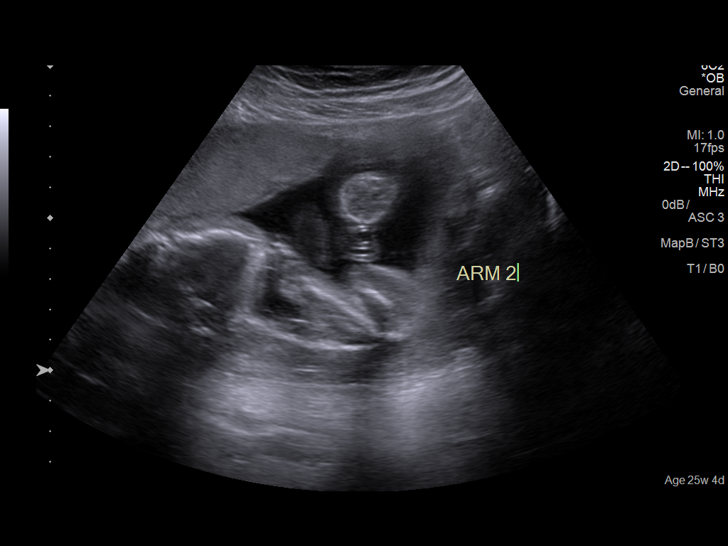
[im 18/69]
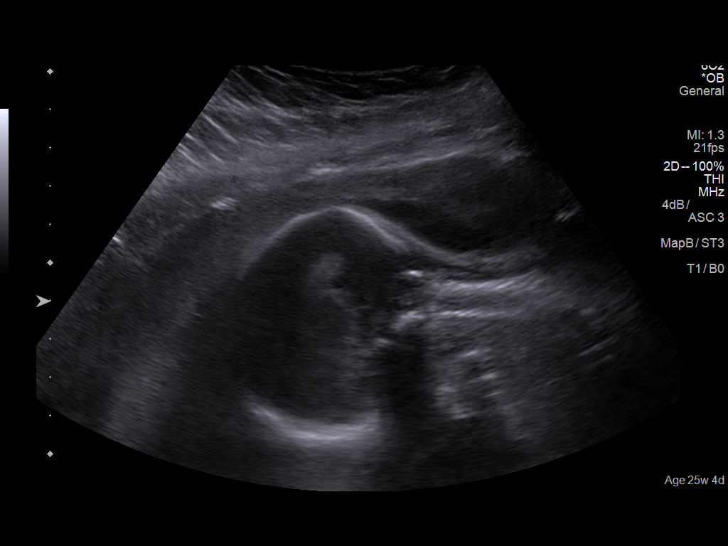
[im 23/69]
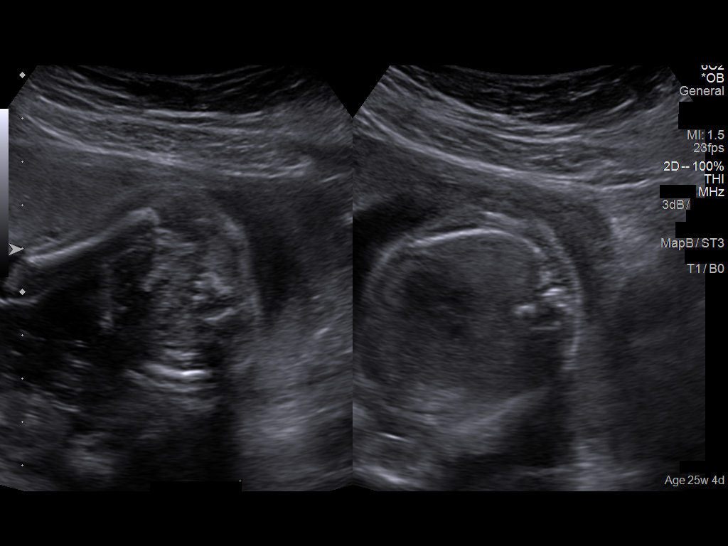
[im 28/69]
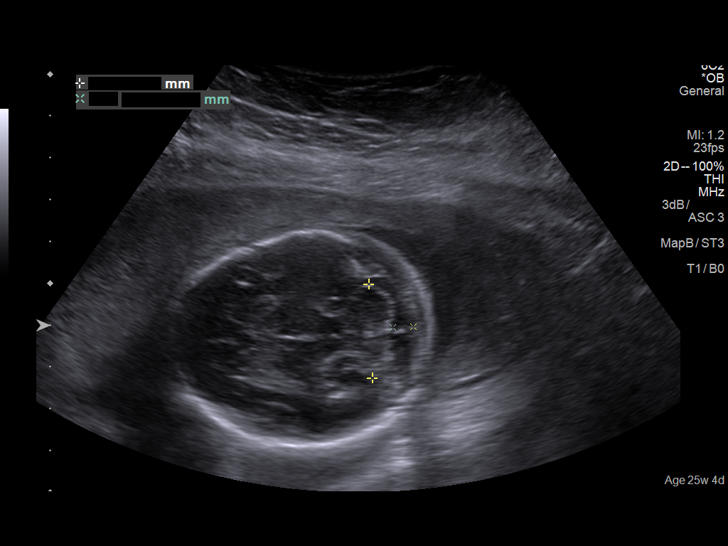
[im 33/69]
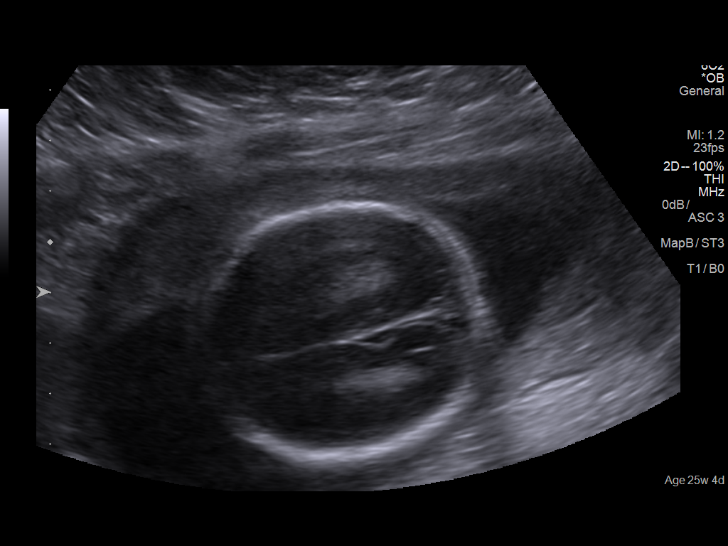
[im 38/69]
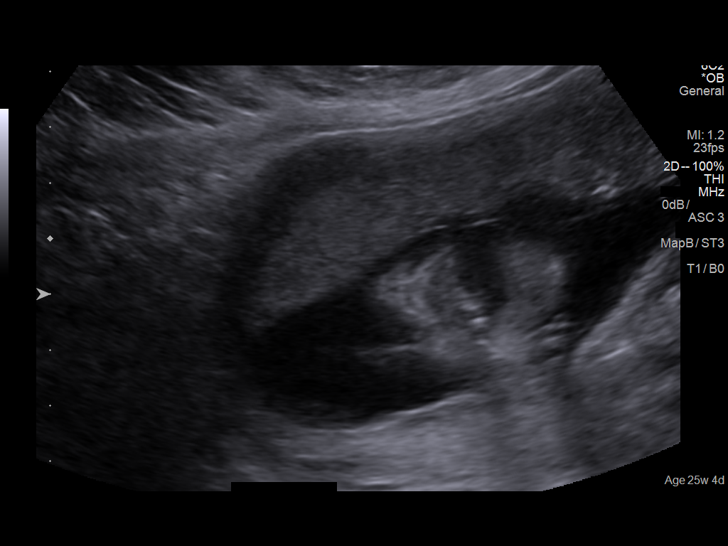
[im 43/69]
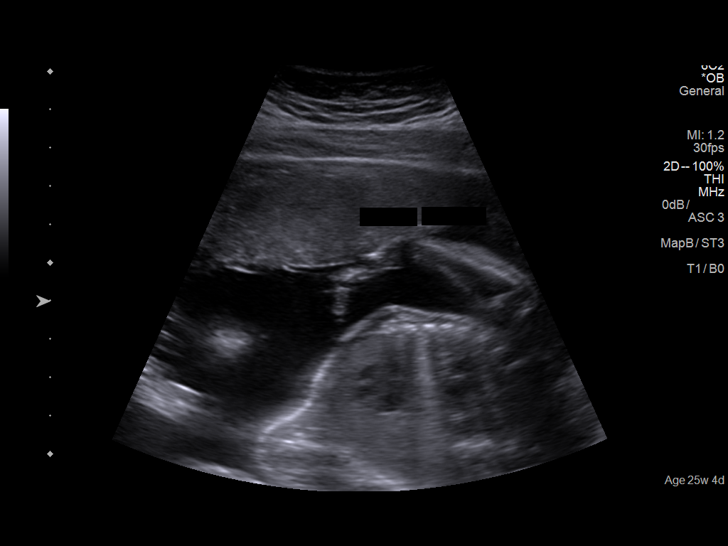
[im 48/69]
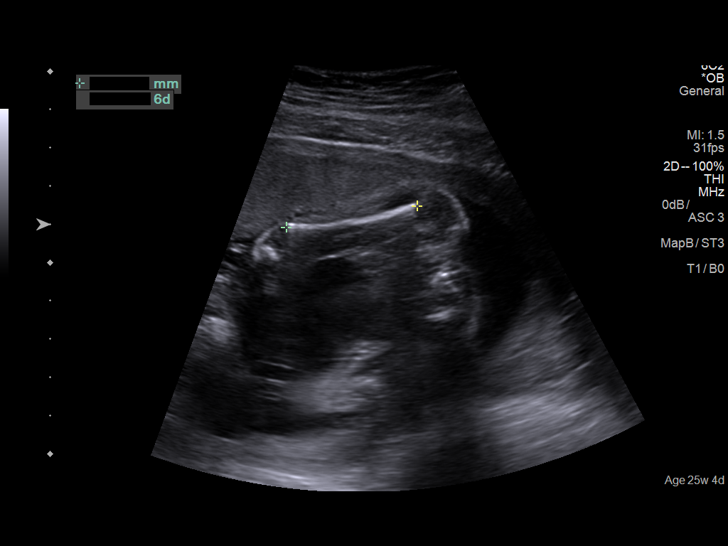
[im 53/69]
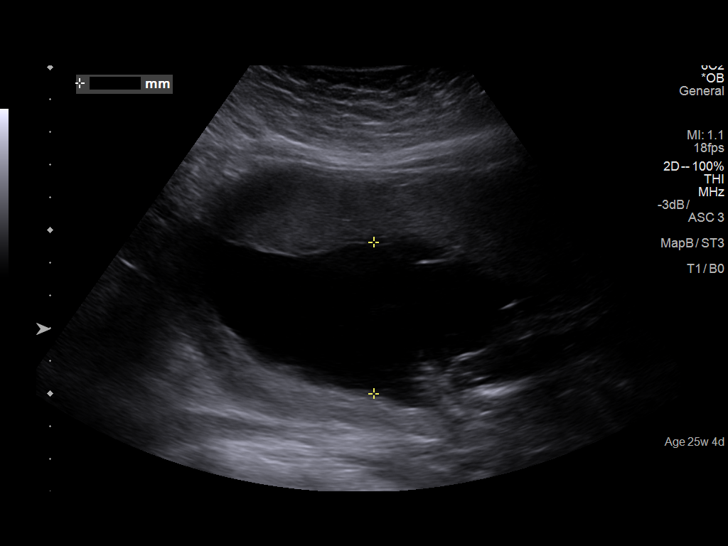
[im 58/69]
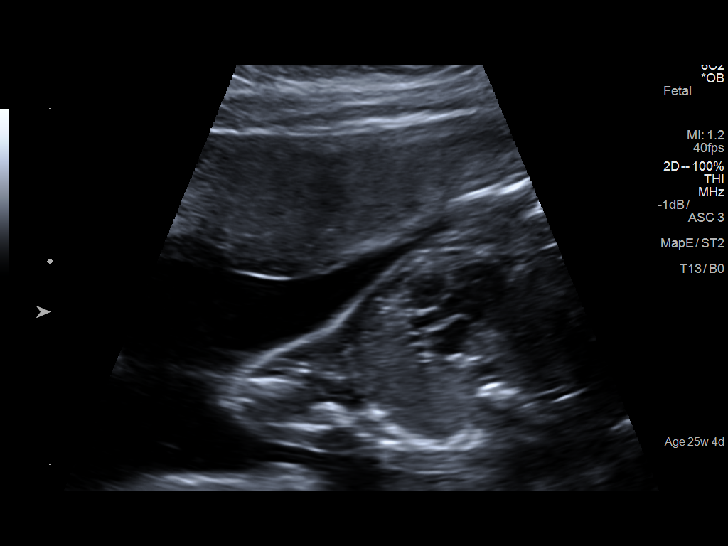
[im 63/69]
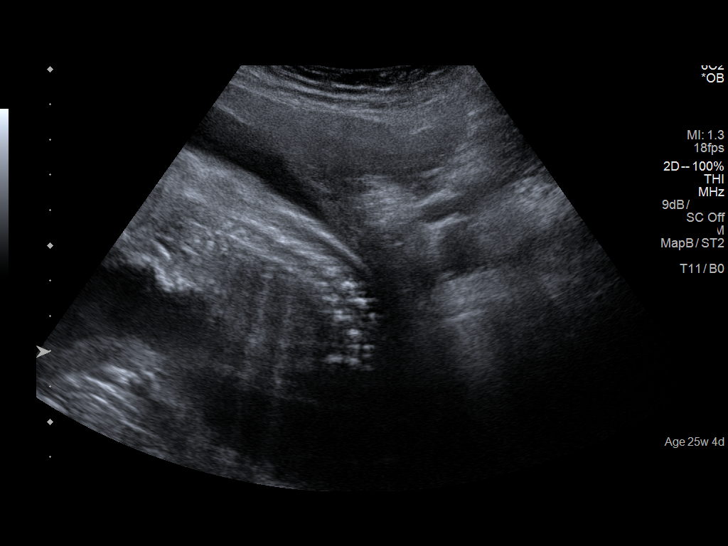
[im 69/69]
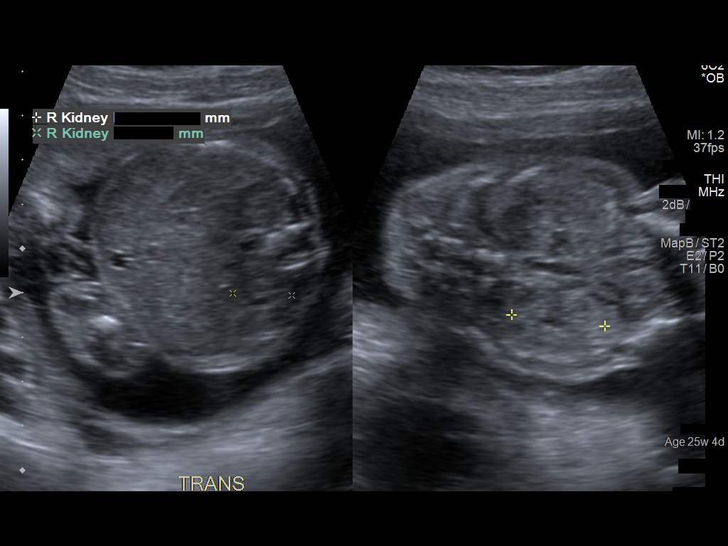

[14 of 28 positions shown; findings below may reference images not displayed]

Canned report from images found in remote index.

Refer to host system for actual result text.

## 2016-08-21 NOTE — L&D Delivery Note (Signed)
Delivery Summary for Sherry KnowsAlicia P Malone  Labor Events:   Preterm labor:   Rupture date:   Rupture time:   Rupture type: Intact  Fluid Color:   Induction:   Augmentation:   Complications:   Cervical ripening:          Delivery:   Episiotomy:   Lacerations:   Repair suture:   Repair # of packets:   Blood loss (ml): 400   Information for the patient's newborn:  [name], Girl [name] [[id number]][[id number]]    Delivery 07/04/2017 12:03 AM by  Vaginal, Spontaneous Sex:  female Gestational Age: 635w6d Delivery Clinician:   Living?:         APGARS  One minute Five minutes Ten minutes  Skin color:        Heart rate:        Grimace:        Muscle tone:        Breathing:        Totals: 8  9      Presentation/position:      Resuscitation:   Cord information:    Disposition of cord blood:     Blood gases sent?  Complications:   Placenta: Delivered:       appearance Newborn Measurements: Weight: 7 lb 0.9 oz (3200 g)  Height: 20.28"  Head circumference:    Chest circumference:    Other providers:    Additional  information: Forceps:   Vacuum:   Breech:   Observed anomalies       Delivery Note At 12:03 AM a viable and healthy female child was delivered via Vaginal, Spontaneous (Presentation: vertex; LOA position).  APGAR: 8, 9; weight 3200 .   Placenta status: spontaneously removed, intact.  Cord: 3-vessel with the following complications: None.  Cord pH: not obtained.  Anesthesia:  IV Stadol (x 1 dose) Episiotomy: None Lacerations: None Suture Repair: none Est. Blood Loss (mL):  400  Mom to postpartum.  Baby to Couplet care / Skin to Skin.  Hildred Lasernika Torin Whisner 07/04/2017, 12:16 AM

## 2016-11-20 ENCOUNTER — Other Ambulatory Visit: Payer: Self-pay | Admitting: Family Medicine

## 2016-11-20 DIAGNOSIS — Z3201 Encounter for pregnancy test, result positive: Secondary | ICD-10-CM

## 2016-12-20 ENCOUNTER — Encounter: Payer: PRIVATE HEALTH INSURANCE | Admitting: Obstetrics and Gynecology

## 2016-12-22 ENCOUNTER — Encounter: Payer: Self-pay | Admitting: Obstetrics and Gynecology

## 2016-12-22 ENCOUNTER — Other Ambulatory Visit: Payer: Self-pay | Admitting: Obstetrics and Gynecology

## 2016-12-22 ENCOUNTER — Ambulatory Visit (INDEPENDENT_AMBULATORY_CARE_PROVIDER_SITE_OTHER): Payer: Self-pay | Admitting: Obstetrics and Gynecology

## 2016-12-22 ENCOUNTER — Encounter: Payer: PRIVATE HEALTH INSURANCE | Admitting: Obstetrics and Gynecology

## 2016-12-22 VITALS — BP 98/54 | HR 82 | Wt 155.0 lb

## 2016-12-22 DIAGNOSIS — Z3482 Encounter for supervision of other normal pregnancy, second trimester: Secondary | ICD-10-CM

## 2016-12-22 DIAGNOSIS — A599 Trichomoniasis, unspecified: Secondary | ICD-10-CM

## 2016-12-22 MED ORDER — METRONIDAZOLE 500 MG PO TABS: ORAL_TABLET | ORAL | 0 refills | Status: DC

## 2016-12-22 NOTE — Progress Notes (Signed)
HPI:      [name] is a 21 y.o. G2P1001 who LMP was Patient's last menstrual period was 09/20/2016 (approximate).  Subjective:   She presents today After recently being diagnosed with pregnancy. She is approximately 13 weeks' gestation based on crown-rump length ultrasound. Her previous pregnancy was uncomplicated. She complains of a vaginal discharge with itching and burning.    Hx: The following portions of the patient's history were reviewed and updated as appropriate:              She  has a past medical history of Asthma and Medical history non-contributory. She  does not have any pertinent problems on file. She  has a past surgical history that includes No past surgeries and Cholecystectomy (N/A, 05/15/2016). Her family history includes Diabetes in her maternal grandmother; Gallstones in her other; Hypertension in her father; Kidney disease in her maternal grandmother. She  reports that she quit smoking about 19 months ago. Her smoking use included Cigarettes. She smoked 0.25 packs per day. She has never used smokeless tobacco. She reports that she uses drugs, including Marijuana and Other-see comments, about 9 times per week. She reports that she does not drink alcohol. Current Outpatient Prescriptions on File Prior to Visit  Medication Sig Dispense Refill  . albuterol (PROVENTIL HFA;VENTOLIN HFA) 108 (90 Base) MCG/ACT inhaler Inhale 1-2 puffs into the lungs every 6 (six) hours as needed.      No current facility-administered medications on file prior to visit.          Review of Systems:  Review of Systems  Constitutional: Denied constitutional symptoms, night sweats, recent illness, fatigue, fever, insomnia and weight loss.  Eyes: Denied eye symptoms, eye pain, photophobia, vision change and visual disturbance.  Ears/Nose/Throat/Neck: Denied ear, nose, throat or neck symptoms, hearing loss, nasal discharge, sinus congestion and sore throat.  Cardiovascular:  Denied cardiovascular symptoms, arrhythmia, chest pain/pressure, edema, exercise intolerance, orthopnea and palpitations.  Respiratory: Denied pulmonary symptoms, asthma, pleuritic pain, productive sputum, cough, dyspnea and wheezing.  Gastrointestinal: Denied, gastro-esophageal reflux, melena, nausea and vomiting.  Genitourinary: See HPI for additional information.  Musculoskeletal: Denied musculoskeletal symptoms, stiffness, swelling, muscle weakness and myalgia.  Dermatologic: Denied dermatology symptoms, rash and scar.  Neurologic: Denied neurology symptoms, dizziness, headache, neck pain and syncope.  Psychiatric: Denied psychiatric symptoms, anxiety and depression.  Endocrine: Denied endocrine symptoms including hot flashes and night sweats.   Meds:   Current Outpatient Prescriptions on File Prior to Visit  Medication Sig Dispense Refill  . albuterol (PROVENTIL HFA;VENTOLIN HFA) 108 (90 Base) MCG/ACT inhaler Inhale 1-2 puffs into the lungs every 6 (six) hours as needed.      No current facility-administered medications on file prior to visit.     Objective:     Vitals:   12/22/16 1203  BP: (!) 98/54  Pulse: 82              Physical examination General NAD, Conversant  HEENT Atraumatic; Op clear with mmm.  Normo-cephalic. Pupils reactive. Anicteric sclerae  Thyroid/Neck Smooth without nodularity or enlargement. Normal ROM.  Neck Supple.  Skin No rashes, lesions or ulceration. Normal palpated skin turgor. No nodularity.  Breasts: No masses or discharge.  Symmetric.  No axillary adenopathy.  Lungs: Clear to auscultation.No rales or wheezes. Normal Respiratory effort, no retractions.  Heart: NSR.  No murmurs or rubs appreciated. No periferal edema  Abdomen: Soft.  Non-tender.  No masses.  No HSM. No hernia  Extremities: Moves all  appropriately.  Normal ROM for age. No lymphadenopathy.  Neuro: Oriented to PPT.  Normal mood. Normal affect.     Pelvic:   Vulva: Normal  appearance.  No lesions.  Vagina: No lesions or abnormalities noted.  Support: Normal pelvic support.  Urethra No masses tenderness or scarring.  Meatus Normal size without lesions or prolapse.  Cervix: Normal appearance.  No lesions.  Anus: Normal exam.  No lesions.  Perineum: Normal exam.  No lesions.        Bimanual   Adnexae: No masses.  Non-tender to palpation.  Uterus: Enlarged. 14wks  POS FHT's  Non-tender.  Mobile.  AV.  Adnexae: No masses.  Non-tender to palpation.  Cul-de-sac: Negative for abnormality.  Adnexae: No masses.  Non-tender to palpation.         Pelvimetry   Diagonal: Reached.  Spines: Average.  Sacrum: Concave.  Pubic Arch: Normal.   WET PREP: clue cells: absent, KOH (yeast): negative, odor: absent and trichomoniasis: positive Ph:  < 4.5    Assessment:    G2P1001 Patient Active Problem List   Diagnosis Date Noted  . Symptomatic cholelithiasis 05/09/2016  . Postpartum care following vaginal delivery 10/25/2015  . Family history of retinitis pigmentosa 06/10/2015  . Menstrual cramp 04/30/2013     1. Encounter for supervision of other normal pregnancy in second trimester   2. Trichomonas vaginalis infection        Plan:            Prenatal Plan 1.  The patient was given prenatal literature. 2.  She was continued on prenatal vitamins. 3.  A prenatal lab panel was ordered or drawn. 4.  Flagyl prescribed for Trichomonas. Literature on Trichomonas given. Treatment of all sexual partners discussed in detail. 5.   Pap GC/CT performed with pelvic exam. 6.  MaterniT - done  Orders Orders Placed This Encounter  Procedures  . Culture, OB Urine  . ABO AND RH   . CBC With Differential  . Hepatitis B surface antigen  . HIV antibody  . MaterniT 21 plus Core, Blood  . Monitor Drug Profile 14(MW)  . Nicotine screen, urine  . RPR  . Rubella screen  . Sickle cell screen  . Urinalysis, Routine w reflex microscopic  . Varicella zoster antibody, IgG   . Antibody screen     Meds ordered this encounter  Medications  . Prenatal Vit-Fe Fumarate-FA (PRENATAL MULTIVITAMIN) TABS tablet    Sig: Take 1 tablet by mouth daily at 12 noon.  . metroNIDAZOLE (FLAGYL) 500 MG tablet    Sig: Take two tablets by mouth twice a day, for one day.  Or you can take all four tablets at once if you can tolerate it.    Dispense:  4 tablet    Refill:  0        F/U  Return in about 3 weeks (around 01/12/2017).  Elonda Huskyavid J. Keerthana Vanrossum, M.D. 12/22/2016 1:21 PM

## 2016-12-26 LAB — PAP IG, CT-NG, RFX HPV ASCU
Chlamydia, Nuc. Acid Amp: NEGATIVE
Gonococcus by Nucleic Acid Amp: NEGATIVE
PAP Smear Comment: 0

## 2016-12-27 ENCOUNTER — Telehealth: Payer: Self-pay

## 2016-12-27 LAB — MONITOR DRUG PROFILE 14(MW)
Amphetamine Scrn, Ur: NEGATIVE ng/mL
BARBITURATE SCREEN URINE: NEGATIVE ng/mL
BENZODIAZEPINE SCREEN, URINE: NEGATIVE ng/mL
Buprenorphine, Urine: NEGATIVE ng/mL
Cocaine (Metab) Scrn, Ur: NEGATIVE ng/mL
Creatinine(Crt), U: 196.3 mg/dL (ref 20.0–300.0)
Fentanyl, Urine: NEGATIVE pg/mL
Meperidine Screen, Urine: NEGATIVE ng/mL
Methadone Screen, Urine: NEGATIVE ng/mL
OXYCODONE+OXYMORPHONE UR QL SCN: NEGATIVE ng/mL
Opiate Scrn, Ur: NEGATIVE ng/mL
Ph of Urine: 7.2 (ref 4.5–8.9)
Phencyclidine Qn, Ur: NEGATIVE ng/mL
Propoxyphene Scrn, Ur: NEGATIVE ng/mL
SPECIFIC GRAVITY: 1.025
Tramadol Screen, Urine: NEGATIVE ng/mL

## 2016-12-27 LAB — URINALYSIS, ROUTINE W REFLEX MICROSCOPIC
Bilirubin, UA: NEGATIVE
Glucose, UA: NEGATIVE
Nitrite, UA: NEGATIVE
Protein, UA: NEGATIVE
RBC, UA: NEGATIVE
Specific Gravity, UA: 1.027 (ref 1.005–1.030)
Urobilinogen, Ur: 0.2 mg/dL (ref 0.2–1.0)
pH, UA: 7 (ref 5.0–7.5)

## 2016-12-27 LAB — CANNABINOID (GC/MS), URINE
Cannabinoid: POSITIVE — AB
Carboxy THC (GC/MS): 178 ng/mL

## 2016-12-27 LAB — MICROSCOPIC EXAMINATION: Casts: NONE SEEN /lpf

## 2016-12-27 LAB — URINE CULTURE, OB REFLEX

## 2016-12-27 LAB — NICOTINE SCREEN, URINE: Cotinine Ql Scrn, Ur: NEGATIVE ng/mL

## 2016-12-27 LAB — CULTURE, OB URINE

## 2016-12-27 MED ORDER — NITROFURANTOIN MONOHYD MACRO 100 MG PO CAPS: 100.0000 mg | ORAL_CAPSULE | Freq: Two times a day (BID) | ORAL | 0 refills | Status: DC

## 2016-12-27 MED ORDER — NITROFURANTOIN MONOHYD MACRO 100 MG PO CAPS: 100.0000 mg | ORAL_CAPSULE | Freq: Two times a day (BID) | ORAL | 1 refills | Status: DC

## 2016-12-27 NOTE — Telephone Encounter (Signed)
Attempted to contact patient- GBS positive. Script for [employer]ro[employer] escribed to pharmacy of choice per provider. Unable to leave message due to mailbox being full.

## 2016-12-27 NOTE — Addendum Note (Signed)
Addended by: Brooke DareSICK, Nijah Orlich L on: 12/27/2016 03:08 PM   Modules accepted: Orders

## 2016-12-27 NOTE — Telephone Encounter (Signed)
Informed patient of negative results pre provider.

## 2016-12-27 NOTE — Telephone Encounter (Signed)
Unable to leave message due to mailbox being full.

## 2016-12-27 NOTE — Telephone Encounter (Signed)
-----   Message from Linzie Collinavid James Evans, MD sent at 12/26/2016  5:01 PM EDT ----- Negative Pap and Negative GC/CT

## 2016-12-29 LAB — HIV ANTIBODY (ROUTINE TESTING W REFLEX): HIV Screen 4th Generation wRfx: NONREACTIVE

## 2016-12-29 LAB — MATERNIT 21 PLUS CORE, BLOOD
Chromosome 13: NEGATIVE
Chromosome 18: NEGATIVE
Chromosome 21: NEGATIVE
Y Chromosome: NOT DETECTED

## 2016-12-29 LAB — CBC WITH DIFFERENTIAL
Basophils Absolute: 0 10*3/uL (ref 0.0–0.2)
Basos: 0 %
EOS (ABSOLUTE): 0.3 10*3/uL (ref 0.0–0.4)
Eos: 3 %
Hematocrit: 35.5 % (ref 34.0–46.6)
Hemoglobin: 12 g/dL (ref 11.1–15.9)
Immature Grans (Abs): 0 10*3/uL (ref 0.0–0.1)
Immature Granulocytes: 0 %
Lymphocytes Absolute: 2.2 10*3/uL (ref 0.7–3.1)
Lymphs: 28 %
MCH: 27.2 pg (ref 26.6–33.0)
MCHC: 33.8 g/dL (ref 31.5–35.7)
MCV: 81 fL (ref 79–97)
Monocytes Absolute: 0.4 10*3/uL (ref 0.1–0.9)
Monocytes: 6 %
Neutrophils Absolute: 5 10*3/uL (ref 1.4–7.0)
Neutrophils: 63 %
RBC: 4.41 x10E6/uL (ref 3.77–5.28)
RDW: 13.6 % (ref 12.3–15.4)
WBC: 7.9 10*3/uL (ref 3.4–10.8)

## 2016-12-29 LAB — VARICELLA ZOSTER ANTIBODY, IGG: Varicella zoster IgG: 454 index (ref >165)

## 2016-12-29 LAB — HEPATITIS B SURFACE ANTIGEN: Hepatitis B Surface Ag: NEGATIVE

## 2016-12-29 LAB — ANTIBODY SCREEN: Antibody Screen: NEGATIVE

## 2016-12-29 LAB — RUBELLA SCREEN: Rubella Antibodies, IGG: 2.03 index (ref >0.99)

## 2016-12-29 LAB — RPR: RPR Ser Ql: NONREACTIVE

## 2016-12-29 LAB — ABO AND RH: Rh Factor: POSITIVE

## 2016-12-29 LAB — SICKLE CELL SCREEN: Sickle Cell Screen: NEGATIVE

## 2017-01-01 ENCOUNTER — Telehealth: Payer: Self-pay

## 2017-01-01 MED ORDER — NITROFURANTOIN MONOHYD MACRO 100 MG PO CAPS: 100.0000 mg | ORAL_CAPSULE | Freq: Two times a day (BID) | ORAL | 0 refills | Status: DC

## 2017-01-01 NOTE — Telephone Encounter (Signed)
-----   Message from Linzie Collinavid James Evans, MD sent at 01/01/2017 11:57 AM EDT ----- Needs MacroBID BID for 7 days - total 14. Thanks

## 2017-01-01 NOTE — Telephone Encounter (Signed)
Spoke with pt- she would like script sent to [facility] [address] Script sent

## 2017-01-16 ENCOUNTER — Ambulatory Visit (INDEPENDENT_AMBULATORY_CARE_PROVIDER_SITE_OTHER): Payer: Self-pay | Admitting: Obstetrics and Gynecology

## 2017-01-16 VITALS — BP 92/53 | HR 79 | Wt 154.3 lb

## 2017-01-16 DIAGNOSIS — O2342 Unspecified infection of urinary tract in pregnancy, second trimester: Secondary | ICD-10-CM

## 2017-01-16 DIAGNOSIS — Z8709 Personal history of other diseases of the respiratory system: Secondary | ICD-10-CM | POA: Insufficient documentation

## 2017-01-16 DIAGNOSIS — L237 Allergic contact dermatitis due to plants, except food: Secondary | ICD-10-CM

## 2017-01-16 DIAGNOSIS — O23592 Infection of other part of genital tract in pregnancy, second trimester: Secondary | ICD-10-CM

## 2017-01-16 DIAGNOSIS — Z3482 Encounter for supervision of other normal pregnancy, second trimester: Secondary | ICD-10-CM

## 2017-01-16 DIAGNOSIS — B951 Streptococcus, group B, as the cause of diseases classified elsewhere: Secondary | ICD-10-CM

## 2017-01-16 DIAGNOSIS — Z87440 Personal history of urinary (tract) infections: Secondary | ICD-10-CM | POA: Insufficient documentation

## 2017-01-16 DIAGNOSIS — Z8619 Personal history of other infectious and parasitic diseases: Secondary | ICD-10-CM | POA: Insufficient documentation

## 2017-01-16 DIAGNOSIS — A5901 Trichomonal vulvovaginitis: Secondary | ICD-10-CM

## 2017-01-16 DIAGNOSIS — F1290 Cannabis use, unspecified, uncomplicated: Secondary | ICD-10-CM

## 2017-01-16 DIAGNOSIS — Z1379 Encounter for other screening for genetic and chromosomal anomalies: Secondary | ICD-10-CM

## 2017-01-16 DIAGNOSIS — O219 Vomiting of pregnancy, unspecified: Secondary | ICD-10-CM

## 2017-01-16 DIAGNOSIS — M5430 Sciatica, unspecified side: Secondary | ICD-10-CM

## 2017-01-16 HISTORY — DX: Personal history of other infectious and parasitic diseases: Z86.19

## 2017-01-16 LAB — POCT URINALYSIS DIPSTICK
Bilirubin, UA: NEGATIVE
Blood, UA: NEGATIVE
Glucose, UA: NEGATIVE
Ketones, UA: NEGATIVE
Leukocytes, UA: NEGATIVE
Nitrite, UA: NEGATIVE
Protein, UA: NEGATIVE
Spec Grav, UA: 1.02 (ref 1.010–1.025)
Urobilinogen, UA: 0.2 E.U./dL
pH, UA: 6 (ref 5.0–8.0)

## 2017-01-16 MED ORDER — DOXYLAMINE-PYRIDOXINE ER 20-20 MG PO TBCR: 1.0000 | EXTENDED_RELEASE_TABLET | Freq: Two times a day (BID) | ORAL | 3 refills | Status: DC

## 2017-01-16 NOTE — Progress Notes (Signed)
ROB: Patient with complaint of sciatic nerve pain, desires referral to chiropractor. Will refer.  Notes that her morning sickness is worse at work. Desires prescription for anti-emetic.  Will prescribe Bonjesta (also given samples).  Patient still currently smoking MJ, patient exam room smells strongly of cannibus.  Counseled again on MJ cessation. AFP ordered today. FOB notes possibly that his sister was affected with spina bifida (or scoliosis, cannot recall which).  AFP ordered today. Patient also notes exposure to poison ivy/oak (by FOB who has had it x 2 weeks).  She has noted symptoms of rash and bumps on legs x 2 days. Is using calamine lotion but wonders if she should be prescribed something. Reassured patient that treatment was for symptomatic relief, can add Benadryl to current regimen if itching present. Also needs to wash sheets in hot water. Desires results of recent genetic screen in envelope, provided.  TOC performed for recent trichomoniasis infection, repeat UA for recent GBS UTI.  Discussed need for Abx in labor due to GBS+ urine.  RTC in 4 weeks, for anatomy scan at that time.

## 2017-01-16 NOTE — Addendum Note (Signed)
Addended by: Fabian NovemberHERRY, Sherry Malone on: 01/16/2017 11:25 PM   Modules accepted: Orders

## 2017-01-18 LAB — AFP, SERUM, OPEN SPINA BIFIDA
AFP MoM: 0.45
AFP Value: 16.3 ng/mL
Gest. Age on Collection Date: 16.9 weeks
Maternal Age At EDD: 21.7 yr
OSBR Risk 1 IN: 10000
Test Results:: NEGATIVE
Weight: 154 [lb_av]

## 2017-01-19 LAB — NUSWAB VAGINITIS PLUS (VG+)
Candida albicans, NAA: NEGATIVE
Candida glabrata, NAA: NEGATIVE
Chlamydia trachomatis, NAA: NEGATIVE
Neisseria gonorrhoeae, NAA: NEGATIVE
Trich vag by NAA: NEGATIVE

## 2017-01-22 ENCOUNTER — Telehealth: Payer: Self-pay

## 2017-01-22 NOTE — Telephone Encounter (Signed)
Called pt informed her of negative results. Pt gave verbal understanding.  

## 2017-01-22 NOTE — Telephone Encounter (Signed)
-----   Message from Hildred LaserAnika Cherry, MD sent at 01/19/2017  8:16 PM EDT ----- Please inform of negative Nuswab

## 2017-01-23 ENCOUNTER — Emergency Department
Admission: EM | Admit: 2017-01-23 | Discharge: 2017-01-23 | Disposition: A | Discharge status: Home / Self Care | Payer: Medicaid Other | Attending: Emergency Medicine | Admitting: Emergency Medicine

## 2017-01-23 ENCOUNTER — Emergency Department: Payer: Medicaid Other

## 2017-01-23 ENCOUNTER — Encounter: Payer: Self-pay | Admitting: Emergency Medicine

## 2017-01-23 DIAGNOSIS — O2342 Unspecified infection of urinary tract in pregnancy, second trimester: Secondary | ICD-10-CM | POA: Insufficient documentation

## 2017-01-23 DIAGNOSIS — O26892 Other specified pregnancy related conditions, second trimester: Secondary | ICD-10-CM | POA: Diagnosis present

## 2017-01-23 DIAGNOSIS — Z87891 Personal history of nicotine dependence: Secondary | ICD-10-CM | POA: Insufficient documentation

## 2017-01-23 DIAGNOSIS — R109 Unspecified abdominal pain: Secondary | ICD-10-CM

## 2017-01-23 DIAGNOSIS — Z3492 Encounter for supervision of normal pregnancy, unspecified, second trimester: Secondary | ICD-10-CM

## 2017-01-23 DIAGNOSIS — Z3A17 17 weeks gestation of pregnancy: Secondary | ICD-10-CM | POA: Diagnosis not present

## 2017-01-23 DIAGNOSIS — N390 Urinary tract infection, site not specified: Secondary | ICD-10-CM

## 2017-01-23 DIAGNOSIS — J45909 Unspecified asthma, uncomplicated: Secondary | ICD-10-CM | POA: Diagnosis not present

## 2017-01-23 LAB — COMPREHENSIVE METABOLIC PANEL
ALT: 10 U/L — ABNORMAL LOW (ref 14–54)
AST: 25 U/L (ref 15–41)
Albumin: 3.6 g/dL (ref 3.5–5.0)
Alkaline Phosphatase: 41 U/L (ref 38–126)
Anion gap: 8 (ref 5–15)
BUN: 7 mg/dL (ref 6–20)
CO2: 20 mmol/L — ABNORMAL LOW (ref 22–32)
Calcium: 8.7 mg/dL — ABNORMAL LOW (ref 8.9–10.3)
Chloride: 109 mmol/L (ref 101–111)
Creatinine, Ser: 0.33 mg/dL — ABNORMAL LOW (ref 0.44–1.00)
GFR calc Af Amer: >60 mL/min (ref >60)
GFR calc non Af Amer: >60 mL/min (ref >60)
Glucose, Bld: 82 mg/dL (ref 65–99)
Potassium: 4.1 mmol/L (ref 3.5–5.1)
Sodium: 137 mmol/L (ref 135–145)
Total Bilirubin: 0.8 mg/dL (ref 0.3–1.2)
Total Protein: 6.8 g/dL (ref 6.5–8.1)

## 2017-01-23 LAB — CBC WITH DIFFERENTIAL/PLATELET
Basophils Absolute: 0 10*3/uL (ref 0–0.1)
Basophils Relative: 1 %
Eosinophils Absolute: 0.2 10*3/uL (ref 0–0.7)
Eosinophils Relative: 3 %
HCT: 34.4 % — ABNORMAL LOW (ref 35.0–47.0)
Hemoglobin: 12.2 g/dL (ref 12.0–16.0)
Lymphocytes Relative: 19 %
Lymphs Abs: 1.4 10*3/uL (ref 1.0–3.6)
MCH: 28.3 pg (ref 26.0–34.0)
MCHC: 35.4 g/dL (ref 32.0–36.0)
MCV: 80 fL (ref 80.0–100.0)
Monocytes Absolute: 0.4 10*3/uL (ref 0.2–0.9)
Monocytes Relative: 5 %
Neutro Abs: 5.3 10*3/uL (ref 1.4–6.5)
Neutrophils Relative %: 72 %
Platelets: 170 10*3/uL (ref 150–440)
RBC: 4.3 MIL/uL (ref 3.80–5.20)
RDW: 13.7 % (ref 11.5–14.5)
WBC: 7.2 10*3/uL (ref 3.6–11.0)

## 2017-01-23 LAB — URINALYSIS, COMPLETE (UACMP) WITH MICROSCOPIC
Bilirubin Urine: NEGATIVE
Glucose, UA: NEGATIVE mg/dL
Hgb urine dipstick: NEGATIVE
Ketones, ur: NEGATIVE mg/dL
Nitrite: NEGATIVE
Protein, ur: 30 mg/dL — AB
Specific Gravity, Urine: 1.025 (ref 1.005–1.030)
pH: 6 (ref 5.0–8.0)

## 2017-01-23 MED ORDER — CEPHALEXIN 500 MG PO CAPS: 500.0000 mg | ORAL_CAPSULE | Freq: Three times a day (TID) | ORAL | 0 refills | Status: DC

## 2017-01-23 MED ORDER — CEPHALEXIN 500 MG PO CAPS
500.0000 mg | ORAL_CAPSULE | Freq: Once | ORAL | Status: AC
  Administered 2017-01-23: 500 mg via ORAL
  Filled 2017-01-23: qty 1

## 2017-01-23 NOTE — ED Provider Notes (Signed)
[facility] Emergency Department Provider Note   ____________________________________________   First MD Initiated Contact with Patient 01/23/17 [phone]     (approximate)  I have reviewed the triage vital signs and the nursing notes.   HISTORY  Chief Complaint Abdominal Pain    HPI Sherry Malone is a 21 y.o. female who presents to the ED from home with a chief complaint of pelvic and back pain. Patient is G2 P1 approximately [redacted] weeks pregnant who was at work when she began to have sharp lower abdominal pain approximately 3 hours ago. Pain radiates into her back. Pain is not associated with fever, chills, chest pain, shortness of breath, nausea, vomiting, diarrhea. Denies vaginal bleeding. Reports vaginal discharge for the past 2 months; seen by her OB/GYN. Patient was recently treated for UTI. Also just had a pelvic exam with negative new swab testing by [facility] this week.Last sexual intercourse yesterday. Nothing makes her symptoms better or worse.   Past Medical History:  Diagnosis Date  . Asthma   . Medical history non-contributory     Patient Active Problem List   Diagnosis Date Noted  . Trichomonal vaginitis during pregnancy in second trimester 01/16/2017  . History of asthma 01/16/2017  . History of urinary infection 01/16/2017  . Marijuana use 01/16/2017  . Postpartum care following vaginal delivery 10/25/2015  . Family history of retinitis pigmentosa 06/10/2015    Past Surgical History:  Procedure Laterality Date  . CHOLECYSTECTOMY N/A 05/15/2016   Procedure: LAPAROSCOPIC CHOLECYSTECTOMY;  Surgeon: Leafy Roiego F Pabon, MD;  Location: [facility] ORS;  Service: General;  Laterality: N/A;  . NO PAST SURGERIES      Prior to Admission medications   Medication Sig Start Date End Date Taking? Authorizing Provider  albuterol (PROVENTIL HFA;VENTOLIN HFA) 108 (90 Base) MCG/ACT inhaler Inhale 1-2 puffs into the lungs every 6 (six) hours as  needed.  12/20/09   [provider]  cephALEXin (KEFLEX) 500 MG capsule Take 1 capsule (500 mg total) by mouth 3 (three) times daily. 01/23/17   Irean HongSung, Jade J, MD  Doxylamine-Pyridoxine ER (BONJESTA) 20-20 MG TBCR Take 1 tablet by mouth 2 (two) times daily. Start with 1 tablet nightly.  If symptoms not improved in 2 days, add morning dose (1 tablet) 01/16/17   Hildred Laserherry, Anika, MD  Prenatal Vit-Fe Fumarate-FA (PRENATAL MULTIVITAMIN) TABS tablet Take 1 tablet by mouth daily at 12 noon.    [provider]    Allergies Patient has no known allergies.  Family History  Problem Relation Age of Onset  . Hypertension Father   . Diabetes Maternal Grandmother   . Kidney disease Maternal Grandmother   . Gallstones Other     Social History Social History  Substance Use Topics  . Smoking status: Former Smoker    Packs/day: 0.25    Types: Cigarettes    Quit date: 04/30/2015  . Smokeless tobacco: Never Used  . Alcohol use No     Comment: rarely    Review of Systems  Constitutional: No fever/chills. Eyes: No visual changes. ENT: No sore throat. Cardiovascular: Denies chest pain. Respiratory: Denies shortness of breath. Gastrointestinal: Positive for pelvic pain. No abdominal pain.  No nausea, no vomiting.  No diarrhea.  No constipation. Genitourinary: Negative for dysuria. Musculoskeletal: Negative for back pain. Skin: Negative for rash. Neurological: Negative for headaches, focal weakness or numbness.   ____________________________________________   PHYSICAL EXAM:  VITAL SIGNS: ED Triage Vitals  Enc Vitals Group     BP  01/23/17 0329 (!) 102/53     Pulse Rate 01/23/17 0329 84     Resp 01/23/17 0329 18     Temp 01/23/17 0329 98.2 F (36.8 C)     Temp Source 01/23/17 0329 Oral     SpO2 01/23/17 0329 100 %     Weight 01/23/17 0319 154 lb (69.9 kg)     Height 01/23/17 0319 5\' 1"  (1.549 m)     Head Circumference --      Peak Flow --      Pain Score 01/23/17 0319 9      Pain Loc --      Pain Edu? --      Excl. in GC? --     Constitutional: Alert and oriented. Well appearing and in no acute distress. Eyes: Conjunctivae are normal. PERRL. EOMI. Head: Atraumatic. Nose: No congestion/rhinnorhea. Mouth/Throat: Mucous membranes are moist.  Oropharynx non-erythematous. Neck: No stridor.   Cardiovascular: Normal rate, regular rhythm. Grossly normal heart sounds.  Good peripheral circulation. Respiratory: Normal respiratory effort.  No retractions. Lungs CTAB. Gastrointestinal: Soft and mildly tender to palpation lower abdomen without rebound or guarding. No distention. No abdominal bruits. No CVA tenderness. Musculoskeletal: No lower extremity tenderness nor edema.  No joint effusions. Neurologic:  Normal speech and language. No gross focal neurologic deficits are appreciated. No gait instability. Skin:  Skin is warm, dry and intact. No rash noted. Psychiatric: Mood and affect are normal. Speech and behavior are normal.  ____________________________________________   LABS (all labs ordered are listed, but only abnormal results are displayed)  Labs Reviewed  COMPREHENSIVE METABOLIC PANEL - Abnormal; Notable for the following:       Result Value   CO2 20 (*)    Creatinine, Ser 0.33 (*)    Calcium 8.7 (*)    ALT 10 (*)    All other components within normal limits  URINALYSIS, COMPLETE (UACMP) WITH MICROSCOPIC - Abnormal; Notable for the following:    Color, Urine YELLOW (*)    APPearance HAZY (*)    Protein, ur 30 (*)    Leukocytes, UA MODERATE (*)    Bacteria, UA RARE (*)    Squamous Epithelial / LPF 6-30 (*)    All other components within normal limits  CBC WITH DIFFERENTIAL/PLATELET - Abnormal; Notable for the following:    HCT 34.4 (*)    All other components within normal limits  URINE CULTURE  CBC WITH DIFFERENTIAL/PLATELET    ____________________________________________  EKG  None ____________________________________________  RADIOLOGY  US Ob Limited  Result Date: 01/23/2017 CLINICAL DATA:  Pregnant patient in second trimester pregnancy with pelvic pain. EXAM: LIMITED OBSTETRIC ULTRASOUND FINDINGS: Number of Fetuses: 1 Heart Rate:  144 bpm Movement: Yes Presentation: Cephalic Placental Location: Anterior Previa: No. Amniotic Fluid (Subjective):  Within normal limits. BPD:  3.95cm 18w  0d MATERNAL FINDINGS: Cervix:  Appears closed. Uterus/Adnexae: No abnormality visualized. Neither ovary was discretely seen. IMPRESSION: Single live intrauterine pregnancy estimated gestational age [redacted] weeks 0 days for estimated date of delivery 06/26/2017. No evident complications. This exam is performed on an emergent basis and does not comprehensively evaluate fetal size, dating, or anatomy; follow-up complete OB US should be considered if further fetal assessment is warranted. Electronically Signed   By: Rubye Oaks M.D.   On: 01/23/2017 05:34    ____________________________________________   PROCEDURES  Procedure(s) performed: None  Procedures  Critical Care performed: No  ____________________________________________   INITIAL IMPRESSION / ASSESSMENT AND PLAN / ED COURSE  Pertinent  labs & imaging results that were available during my care of the patient were reviewed by me and considered in my medical decision making (see chart for details).  21 year old female approximately [redacted] weeks pregnant who presents with pelvic pain. Recent nuswab negative by OB/GYN. Will check screening lab work, urinalysis and OB ultrasound.  Clinical Course as of Jan 24 548  Tue Jan 23, 2017  0545 Updated patient and) of laboratory and imaging results. Will place on Keflex for UTI. Urine culture is pending. Patient will follow-up with her OB/GYN closely. Strict return precautions given. Patient verbalizes understanding and agrees  with plan of care.  [JS]    Clinical Course User Index [JS] Irean Hong, MD     ____________________________________________   FINAL CLINICAL IMPRESSION(S) / ED DIAGNOSES  Final diagnoses:  Lower urinary tract infectious disease  Second trimester pregnancy  Abdominal pain during pregnancy in second trimester      NEW MEDICATIONS STARTED DURING THIS VISIT:  New Prescriptions   CEPHALEXIN (KEFLEX) 500 MG CAPSULE    Take 1 capsule (500 mg total) by mouth 3 (three) times daily.     Note:  This document was prepared using Dragon voice recognition software and may include unintentional dictation errors.    Irean Hong, MD 01/23/17 [phone]

## 2017-01-23 NOTE — Discharge Instructions (Signed)
1. Take antibiotic as prescribed (Keflex 500 mg 3 times daily 7 days). 2. Urine cultures pending. 3. Return to the ER for symptoms, persistent vomiting, difficulty breathing or other concerns.

## 2017-01-23 NOTE — ED Triage Notes (Signed)
Patient ambulatory to triage with steady gait, without difficulty or distress noted; pt reports EDC 11/7 with sharp abd pain last few hours with back pain; G2P1; pt at [facility] (Dr Valentino Saxonherry & Dr Logan BoresEvans) with no complications

## 2017-01-26 LAB — URINE CULTURE: Culture: 20000 — AB

## 2017-02-02 ENCOUNTER — Ambulatory Visit (INDEPENDENT_AMBULATORY_CARE_PROVIDER_SITE_OTHER): Payer: Medicaid Other | Admitting: Certified Nurse Midwife

## 2017-02-02 ENCOUNTER — Encounter: Payer: Medicaid Other | Admitting: Certified Nurse Midwife

## 2017-02-02 ENCOUNTER — Encounter: Payer: Self-pay | Admitting: Certified Nurse Midwife

## 2017-02-02 VITALS — BP 97/53 | HR 91 | Wt 153.2 lb

## 2017-02-02 DIAGNOSIS — Z3492 Encounter for supervision of normal pregnancy, unspecified, second trimester: Secondary | ICD-10-CM | POA: Diagnosis not present

## 2017-02-02 LAB — POCT URINALYSIS DIPSTICK
Bilirubin, UA: NEGATIVE
Blood, UA: NEGATIVE
Glucose, UA: NEGATIVE
Ketones, UA: NEGATIVE
Nitrite, UA: NEGATIVE
Protein, UA: NEGATIVE
Spec Grav, UA: 1.015 (ref 1.010–1.025)
Urobilinogen, UA: 0.2 E.U./dL
pH, UA: 6.5 (ref 5.0–8.0)

## 2017-02-02 MED ORDER — AMOXICILLIN 500 MG PO CAPS: 500.0000 mg | ORAL_CAPSULE | Freq: Two times a day (BID) | ORAL | 0 refills | Status: AC

## 2017-02-02 NOTE — Progress Notes (Signed)
Followup to ED visit on 01/23/17? C/o bad pains in stomach. States it shoots up to her ribs.States she is still smoking marijuana

## 2017-02-02 NOTE — Patient Instructions (Signed)

## 2017-02-02 NOTE — Progress Notes (Signed)
Pt presents today for problem visit. She was at the hospital on 6/5 for lower and upper abdominal pain. Urine culture shows 20,000 COLONIES/mL GROUP B STREP(S.AGALACTIAE)ISOLATED . She complains of urinary burning and urgency. She was given Keflex 500mg . Pt. Instructed to stop keflex and start amoxicillin to treat the GBS. Encouraged PO hydration and cranberry juice. She also complains of musculoskeletal discomforts. Encouraged use of belly band and tylenol. She agrees to plan. FHT today 153. Follow up as scheduled for ROB & ultrasound.   Doreene BurkeAnnie Maudene Stotler ,CNM

## 2017-02-14 ENCOUNTER — Ambulatory Visit (INDEPENDENT_AMBULATORY_CARE_PROVIDER_SITE_OTHER): Payer: Medicaid Other

## 2017-02-14 ENCOUNTER — Encounter: Payer: Medicaid Other | Admitting: Obstetrics and Gynecology

## 2017-02-14 DIAGNOSIS — Z3482 Encounter for supervision of other normal pregnancy, second trimester: Secondary | ICD-10-CM

## 2017-02-20 ENCOUNTER — Encounter: Payer: Self-pay | Admitting: Obstetrics and Gynecology

## 2017-02-20 ENCOUNTER — Ambulatory Visit (INDEPENDENT_AMBULATORY_CARE_PROVIDER_SITE_OTHER): Payer: Medicaid Other | Admitting: Obstetrics and Gynecology

## 2017-02-20 VITALS — BP 85/50 | HR 96 | Wt 154.5 lb

## 2017-02-20 DIAGNOSIS — Z3492 Encounter for supervision of normal pregnancy, unspecified, second trimester: Secondary | ICD-10-CM

## 2017-02-20 DIAGNOSIS — O4422 Partial placenta previa NOS or without hemorrhage, second trimester: Secondary | ICD-10-CM

## 2017-02-20 LAB — POCT URINALYSIS DIPSTICK
Bilirubin, UA: NEGATIVE
Blood, UA: NEGATIVE
Glucose, UA: NEGATIVE
Ketones, UA: NEGATIVE
Leukocytes, UA: NEGATIVE
Nitrite, UA: NEGATIVE
Protein, UA: NEGATIVE
Spec Grav, UA: 1.025 (ref 1.010–1.025)
Urobilinogen, UA: 0.2 E.U./dL
pH, UA: 5 (ref 5.0–8.0)

## 2017-02-20 NOTE — Progress Notes (Signed)
ROB:  Urinary symptoms resolved.  "ordered a belly-band"  Upper abd pain resolved.  U/S = possible marginal placentation - f/u U/S @ 28 wks.

## 2017-03-20 ENCOUNTER — Ambulatory Visit (INDEPENDENT_AMBULATORY_CARE_PROVIDER_SITE_OTHER): Payer: Medicaid Other | Admitting: Obstetrics and Gynecology

## 2017-03-20 VITALS — BP 85/50 | HR 75 | Wt 157.9 lb

## 2017-03-20 DIAGNOSIS — Z3482 Encounter for supervision of other normal pregnancy, second trimester: Secondary | ICD-10-CM

## 2017-03-20 DIAGNOSIS — Z131 Encounter for screening for diabetes mellitus: Secondary | ICD-10-CM

## 2017-03-20 DIAGNOSIS — Z130 Encounter for screening for diseases of the blood and blood-forming organs and certain disorders involving the immune mechanism: Secondary | ICD-10-CM

## 2017-03-20 LAB — POCT URINALYSIS DIPSTICK
Bilirubin, UA: NEGATIVE
Blood, UA: NEGATIVE
Glucose, UA: NEGATIVE
Ketones, UA: NEGATIVE
Nitrite, UA: NEGATIVE
Protein, UA: NEGATIVE
Spec Grav, UA: 1.01 (ref 1.010–1.025)
Urobilinogen, UA: 0.2 E.U./dL
pH, UA: 6 (ref 5.0–8.0)

## 2017-03-20 NOTE — Progress Notes (Signed)
ROB: Patient notes intense Braxton Hicks yesterday.  Discussed resting, increasing hydration, Tylenol for pain.  RTC in 2 weeks.

## 2017-03-28 ENCOUNTER — Telehealth: Payer: Self-pay

## 2017-03-28 DIAGNOSIS — M542 Cervicalgia: Secondary | ICD-10-CM | POA: Diagnosis not present

## 2017-03-28 DIAGNOSIS — M545 Low back pain: Secondary | ICD-10-CM | POA: Diagnosis not present

## 2017-03-28 DIAGNOSIS — M9901 Segmental and somatic dysfunction of cervical region: Secondary | ICD-10-CM | POA: Diagnosis not present

## 2017-03-28 DIAGNOSIS — M9903 Segmental and somatic dysfunction of lumbar region: Secondary | ICD-10-CM | POA: Diagnosis not present

## 2017-03-28 NOTE — Telephone Encounter (Signed)
Called pt informed her that FMLA paperwork has been completed and can be picked up at her convenience.

## 2017-04-03 ENCOUNTER — Encounter: Payer: Self-pay | Admitting: Obstetrics and Gynecology

## 2017-04-03 ENCOUNTER — Telehealth: Payer: Self-pay | Admitting: Obstetrics and Gynecology

## 2017-04-03 ENCOUNTER — Ambulatory Visit (INDEPENDENT_AMBULATORY_CARE_PROVIDER_SITE_OTHER): Payer: Medicaid Other | Admitting: Obstetrics and Gynecology

## 2017-04-03 VITALS — BP 104/61 | HR 83 | Wt 157.1 lb

## 2017-04-03 DIAGNOSIS — Z3482 Encounter for supervision of other normal pregnancy, second trimester: Secondary | ICD-10-CM

## 2017-04-03 LAB — POCT URINALYSIS DIPSTICK
Bilirubin, UA: NEGATIVE
Blood, UA: NEGATIVE
Glucose, UA: NEGATIVE
Ketones, UA: NEGATIVE
Leukocytes, UA: NEGATIVE
Nitrite, UA: NEGATIVE
Protein, UA: NEGATIVE
Spec Grav, UA: 1.01 (ref 1.010–1.025)
Urobilinogen, UA: 0.2 E.U./dL
pH, UA: 5 (ref 5.0–8.0)

## 2017-04-03 NOTE — Telephone Encounter (Signed)
Appointment scheduled with patient for 11am

## 2017-04-03 NOTE — Telephone Encounter (Signed)
Patient not feeling well - light headed - headaches - possible low BP per patient - would like to be seen asap - BP was 89/54 last week at her chiropractor appointment   Please call

## 2017-04-03 NOTE — Progress Notes (Signed)
ROB:  Work-in  Patient with complaint of occasional lightheadedness. Happens at random times and not associated with anything in particular. Also complains of being on her feet during her job the whole time-states she walks 14 miles per night. Also complaining of "too many"Braxton Hicks contractions - not regular.  We have discussed increased hydration, possible hypoglycemia and solutions for this, and talking points to discuss at work regarding keeping her job but doing something less strenuous.

## 2017-04-03 NOTE — Telephone Encounter (Signed)
Pls put pt in with [facility]- same day slot- 11:30 today.

## 2017-04-04 DIAGNOSIS — M545 Low back pain: Secondary | ICD-10-CM | POA: Diagnosis not present

## 2017-04-04 DIAGNOSIS — M542 Cervicalgia: Secondary | ICD-10-CM | POA: Diagnosis not present

## 2017-04-04 DIAGNOSIS — M9901 Segmental and somatic dysfunction of cervical region: Secondary | ICD-10-CM | POA: Diagnosis not present

## 2017-04-04 DIAGNOSIS — M9903 Segmental and somatic dysfunction of lumbar region: Secondary | ICD-10-CM | POA: Diagnosis not present

## 2017-04-17 ENCOUNTER — Ambulatory Visit (INDEPENDENT_AMBULATORY_CARE_PROVIDER_SITE_OTHER): Payer: Medicaid Other | Admitting: Obstetrics and Gynecology

## 2017-04-17 ENCOUNTER — Other Ambulatory Visit: Payer: Medicaid Other

## 2017-04-17 ENCOUNTER — Encounter: Payer: Self-pay | Admitting: Obstetrics and Gynecology

## 2017-04-17 ENCOUNTER — Observation Stay
Admission: EM | Admit: 2017-04-17 | Discharge: 2017-04-17 | Disposition: A | Discharge status: Home / Self Care | Payer: Medicaid Other | Attending: Obstetrics and Gynecology | Admitting: Obstetrics and Gynecology

## 2017-04-17 ENCOUNTER — Telehealth: Payer: Self-pay | Admitting: Obstetrics and Gynecology

## 2017-04-17 VITALS — BP 99/62 | HR 99 | Wt 159.6 lb

## 2017-04-17 DIAGNOSIS — Z130 Encounter for screening for diseases of the blood and blood-forming organs and certain disorders involving the immune mechanism: Secondary | ICD-10-CM

## 2017-04-17 DIAGNOSIS — Z131 Encounter for screening for diabetes mellitus: Secondary | ICD-10-CM

## 2017-04-17 DIAGNOSIS — R102 Pelvic and perineal pain: Secondary | ICD-10-CM

## 2017-04-17 DIAGNOSIS — O26893 Other specified pregnancy related conditions, third trimester: Principal | ICD-10-CM | POA: Insufficient documentation

## 2017-04-17 DIAGNOSIS — Z3A29 29 weeks gestation of pregnancy: Secondary | ICD-10-CM | POA: Diagnosis not present

## 2017-04-17 DIAGNOSIS — Z23 Encounter for immunization: Secondary | ICD-10-CM | POA: Diagnosis not present

## 2017-04-17 DIAGNOSIS — O43103 Malformation of placenta, unspecified, third trimester: Secondary | ICD-10-CM

## 2017-04-17 DIAGNOSIS — Z3483 Encounter for supervision of other normal pregnancy, third trimester: Secondary | ICD-10-CM

## 2017-04-17 LAB — POCT URINALYSIS DIPSTICK
Bilirubin, UA: NEGATIVE
Blood, UA: NEGATIVE
Glucose, UA: NEGATIVE
Leukocytes, UA: NEGATIVE
Nitrite, UA: NEGATIVE
Protein, UA: NEGATIVE
Spec Grav, UA: 1.01 (ref 1.010–1.025)
Urobilinogen, UA: 0.2 E.U./dL
pH, UA: 6.5 (ref 5.0–8.0)

## 2017-04-17 NOTE — Discharge Summary (Signed)
     L&D OB Triage Note  SUBJECTIVE Sherry Malone is a 21 y.o. G2P1001 female at [redacted]w[redacted]d, EDD Estimated Date of Delivery: 06/27/17 who presented to triage with complaints of lower pelvic pain.  Denies leaking fluid denies bleeding denies active contractions.  Obstetric History   G2   P1   T1   P0   A0   L1    SAB0   TAB0   Ectopic0   Multiple0   Live Births1     # Outcome Date GA Lbr Len/2nd Weight Sex Delivery Anes PTL Lv  2 Current           1 Term 10/26/15 [redacted]w[redacted]d 20:15 / 00:26 6 lb 4.2 oz (2.84 kg) F Vag-Forceps EPI  LIV     Name: ALONSO CONTRERAS,GIRL Rayne     Apgar1: 10                Apgar5: 9      No prescriptions prior to admission.     OBJECTIVE  Nursing Evaluation:   BP (!) 103/53   Pulse 89   Temp 98.2 F (36.8 C) (Oral)   Resp 18   Ht 5\' 1"  (1.549 m)   Wt 157 lb (71.2 kg)   LMP 09/20/2016 (Approximate)   BMI 29.66 kg/m    Findings:  1 contraction noted in 1 hour.  NST was performed and has been reviewed by me.  NST INTERPRETATION: Category I  Mode: External Baseline Rate (A): 135 bpm Variability: Moderate Accelerations: 15 x 15 Decelerations: None     Contraction Frequency (min): rare  ASSESSMENT Impression:  1. Pregnancy:  G2P1001 at [redacted]w[redacted]d , EDD Estimated Date of Delivery: 06/27/17 2.  reactive  PLAN 1. Reassurance given 2. Discharge home with precautions  3. Continue routine prenatal care.

## 2017-04-17 NOTE — Progress Notes (Signed)
ROB:  Patient seen in L&D last night for lower pelvic pain. Her pain lasts for 30 seconds and then resolves. It is worse with active movement. Sounds very much like round ligament pain. Patient doing her 1 hour GCT today. After working last night she did not eat or drink because she was concerned about what she couldn't eat or drink and she did not want to mess up her GCT. (I believe this explains her ketones.)  Ultrasound ordered for follow-up of marginal cord insertion and fetal growth.

## 2017-04-17 NOTE — OB Triage Note (Signed)
Pt presents to L&D after leaving from working night shift with c/o abd tightening q 5-10 min. Pt denies leaking of fluid or vaginal bleeding and reports good fetal movement. Pt states she is on her feet at work and is on her feet 10-12 miles a shift. EFM applied and explained. Plan to monitor fetal and maternal well being and assess pt complaint.

## 2017-04-17 NOTE — Discharge Instructions (Signed)
Call provider or return to [facility]place with: ? ?1. Regular contractions ?2. Leaking of fluid from your vagina ?3. Vaginal bleeding: Bright red or heavy like a period ?4. Decreased Fetal movement  ?

## 2017-04-17 NOTE — Telephone Encounter (Signed)
You spoke with her this am about lighter duty at work - being off her feet more. Patient is on her feet and walking about 13 miles on her 8 hour shift. You and Dr Valentino Saxon had mentioned another position possibly until delivery per patient. Her employer needs a note faxed with the restrictions you want for her faxed.  Fax to: [phone] Attn: Mrs. French Ana   Please call patient and let her know when note has been faxed.

## 2017-04-18 LAB — CBC
Hematocrit: 34.3 % (ref 34.0–46.6)
Hemoglobin: 11.3 g/dL (ref 11.1–15.9)
MCH: 27.5 pg (ref 26.6–33.0)
MCHC: 32.9 g/dL (ref 31.5–35.7)
MCV: 84 fL (ref 79–97)
Platelets: 182 10*3/uL (ref 150–379)
RBC: 4.11 x10E6/uL (ref 3.77–5.28)
RDW: 13.5 % (ref 12.3–15.4)
WBC: 8.6 10*3/uL (ref 3.4–10.8)

## 2017-04-18 LAB — GLUCOSE, 1 HOUR GESTATIONAL: Gestational Diabetes Screen: 109 mg/dL (ref 65–139)

## 2017-04-18 NOTE — Telephone Encounter (Signed)
Spoke with pt- she would like to do less walking- she states it make the round ligament pain worse. Please advise.

## 2017-04-19 ENCOUNTER — Telehealth: Payer: Self-pay | Admitting: Obstetrics and Gynecology

## 2017-04-19 NOTE — Telephone Encounter (Signed)
Note faxed with confirmation. Pt informed

## 2017-04-19 NOTE — Telephone Encounter (Signed)
Spoke with French Anaracy- states pt gets a 15 min break  2 hrs into her shift, 1/2 hr dinner break and a 15 min break in the afternoon. She stressed employees are mandated to take breaks. Employer will use discretion for further breaks.

## 2017-04-19 NOTE — Telephone Encounter (Signed)
Sherry Malone with [employer] stated that she received pt's work note but would like to speak with a nurse to define what is considered to be prolonged walking and how often pt should take a break after walking a certain amount of time. CB# [phone]. Please advise. Thanks TNP

## 2017-04-25 ENCOUNTER — Telehealth: Payer: Self-pay | Admitting: Obstetrics and Gynecology

## 2017-04-25 DIAGNOSIS — M9903 Segmental and somatic dysfunction of lumbar region: Secondary | ICD-10-CM | POA: Diagnosis not present

## 2017-04-25 DIAGNOSIS — M9901 Segmental and somatic dysfunction of cervical region: Secondary | ICD-10-CM | POA: Diagnosis not present

## 2017-04-25 DIAGNOSIS — M545 Low back pain: Secondary | ICD-10-CM | POA: Diagnosis not present

## 2017-04-25 DIAGNOSIS — M542 Cervicalgia: Secondary | ICD-10-CM | POA: Diagnosis not present

## 2017-04-25 NOTE — Telephone Encounter (Signed)
Patient called with questions regarding her work note. She stated it was supposed to say no walking at all. She would like a call back. Thanks

## 2017-04-25 NOTE — Telephone Encounter (Signed)
Attempted to contact pt- no answer and mailbox is full so unable to leave message. I spoke to Ms French Anaracy at pts work place- she states all employees are given 2 15 minute breaks and a 1/2hr  for lunch. 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 This was reinterated to pt. The note reflects the providers instructions.

## 2017-04-25 NOTE — Telephone Encounter (Signed)
Pt returned my call- encouraged her to sit for a few minutes when she started to feel uncomfortable and to speak with French Anaracy at work regarding this.

## 2017-05-01 ENCOUNTER — Ambulatory Visit (INDEPENDENT_AMBULATORY_CARE_PROVIDER_SITE_OTHER): Payer: Medicaid Other

## 2017-05-01 DIAGNOSIS — O43103 Malformation of placenta, unspecified, third trimester: Secondary | ICD-10-CM | POA: Diagnosis not present

## 2017-05-08 ENCOUNTER — Encounter: Payer: Self-pay | Admitting: Obstetrics and Gynecology

## 2017-05-08 ENCOUNTER — Ambulatory Visit (INDEPENDENT_AMBULATORY_CARE_PROVIDER_SITE_OTHER): Payer: Medicaid Other | Admitting: Obstetrics and Gynecology

## 2017-05-08 VITALS — BP 96/63 | HR 119 | Wt 162.3 lb

## 2017-05-08 DIAGNOSIS — O26893 Other specified pregnancy related conditions, third trimester: Secondary | ICD-10-CM

## 2017-05-08 DIAGNOSIS — M549 Dorsalgia, unspecified: Secondary | ICD-10-CM

## 2017-05-08 DIAGNOSIS — Z3483 Encounter for supervision of other normal pregnancy, third trimester: Secondary | ICD-10-CM

## 2017-05-08 DIAGNOSIS — O9989 Other specified diseases and conditions complicating pregnancy, childbirth and the puerperium: Secondary | ICD-10-CM

## 2017-05-08 DIAGNOSIS — F1290 Cannabis use, unspecified, uncomplicated: Secondary | ICD-10-CM

## 2017-05-08 DIAGNOSIS — O43193 Other malformation of placenta, third trimester: Secondary | ICD-10-CM

## 2017-05-08 DIAGNOSIS — O99891 Other specified diseases and conditions complicating pregnancy: Secondary | ICD-10-CM

## 2017-05-08 DIAGNOSIS — R42 Dizziness and giddiness: Secondary | ICD-10-CM

## 2017-05-08 NOTE — Progress Notes (Signed)
ROB: Notes dizzy spells that have worsened since last visit, however denies syncopal episodes. Also noting worsening back pain and abdominal pain.  Notes her job is still very limited in the time allotted for breaks.  Offered new letter outlining more specific needs during pregnancy. Also encouraged to increase hydration, and to keep snacks nearby. Also discussed pregnancy girdle, Tylenol ES for pain.  Patient s/p f/u scan ~ 2 weeks ago for possible marginal placental cord insertion, noted to be normal now. Patient informed of results. 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 RTC in 2 weeks.

## 2017-05-14 ENCOUNTER — Telehealth: Payer: Self-pay | Admitting: Obstetrics and Gynecology

## 2017-05-14 NOTE — Telephone Encounter (Signed)
Patient called and stated that she would like to speak with a nurse or Dr. Valentino Saxon in regards to speaking about her FMLA paperwork date being changed, The patient is checking to see if it is completed so the patient is able to get paid moving forward. No other information was disclosed other than wanting to speak with someone. Please advise.

## 2017-05-14 NOTE — Telephone Encounter (Signed)
Called pt informed her that amended FMLA was ready for her to pick up. Pt gave verbal understanding.

## 2017-05-22 ENCOUNTER — Ambulatory Visit (INDEPENDENT_AMBULATORY_CARE_PROVIDER_SITE_OTHER): Payer: Medicaid Other | Admitting: Obstetrics and Gynecology

## 2017-05-22 VITALS — BP 94/60 | HR 99 | Wt 164.5 lb

## 2017-05-22 DIAGNOSIS — Z3483 Encounter for supervision of other normal pregnancy, third trimester: Secondary | ICD-10-CM

## 2017-05-22 LAB — POCT URINALYSIS DIPSTICK
Bilirubin, UA: NEGATIVE
Blood, UA: NEGATIVE
Glucose, UA: NEGATIVE
Ketones, UA: NEGATIVE
Leukocytes, UA: NEGATIVE
Nitrite, UA: NEGATIVE
Protein, UA: NEGATIVE
Spec Grav, UA: 1.025 (ref 1.010–1.025)
Urobilinogen, UA: 0.2 E.U./dL
pH, UA: 5 (ref 5.0–8.0)

## 2017-05-22 NOTE — Progress Notes (Signed)
ROB:  No complaints.  Active movement.  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

## 2017-06-05 ENCOUNTER — Encounter: Payer: Self-pay | Admitting: Obstetrics and Gynecology

## 2017-06-05 ENCOUNTER — Ambulatory Visit (INDEPENDENT_AMBULATORY_CARE_PROVIDER_SITE_OTHER): Payer: Medicaid Other | Admitting: Obstetrics and Gynecology

## 2017-06-05 VITALS — BP 98/62 | HR 99 | Wt 166.2 lb

## 2017-06-05 DIAGNOSIS — F1290 Cannabis use, unspecified, uncomplicated: Secondary | ICD-10-CM

## 2017-06-05 DIAGNOSIS — Z113 Encounter for screening for infections with a predominantly sexual mode of transmission: Secondary | ICD-10-CM

## 2017-06-05 DIAGNOSIS — Z3685 Encounter for antenatal screening for Streptococcus B: Secondary | ICD-10-CM

## 2017-06-05 DIAGNOSIS — Z3483 Encounter for supervision of other normal pregnancy, third trimester: Secondary | ICD-10-CM

## 2017-06-05 DIAGNOSIS — R0989 Other specified symptoms and signs involving the circulatory and respiratory systems: Secondary | ICD-10-CM

## 2017-06-05 LAB — POCT URINALYSIS DIPSTICK
Bilirubin, UA: NEGATIVE
Blood, UA: NEGATIVE
Glucose, UA: NEGATIVE
Ketones, UA: NEGATIVE
Leukocytes, UA: NEGATIVE
Nitrite, UA: NEGATIVE
Spec Grav, UA: 1.015 (ref 1.010–1.025)
Urobilinogen, UA: 0.2 E.U./dL
pH, UA: 6.5 (ref 5.0–8.0)

## 2017-06-05 NOTE — Progress Notes (Signed)
ROB- Pt is c/o pain in pressure and some right back pain, would like to hold off on flu shot

## 2017-06-05 NOTE — Patient Instructions (Addendum)
Pregnancy and Influenza Influenza, also called the flu, is an infection of the respiratory tract. If you are pregnant, you are more likely to catch the flu. You are also more likely to have a more serious case of the flu. This is because pregnancy lowers your body's ability to fight off infections (it weakens your immune system). It also puts additional stress on your heart and lungs, which makes you more likely to have complications. Having a bad case of the flu, especially with a high fever, can be dangerous for your developing baby. It can cause you to go into early labor. How do people get the flu? The flu is caused by the influenza virus. This virus is common every year in the fall and winter. It spreads when virus particles get passed from person to person. You can get the virus if you are near a sick person who is coughing or sneezing. You can also get the virus if you touch something that has the virus on it and then touch your face. How can I protect myself against the flu?  Get a flu shot. The best way to prevent the flu is to get a flu shot before flu season starts. The flu shot is not dangerous for your developing baby. It may even help protect your baby from the flu for up to 6 months after birth. The flu shot is one type of flu vaccine. Another type is a nasal spray vaccine. Do not get the nasal spray vaccine. It is not approved for pregnancy.  Do not come in close contact with sick people.  Do not share food, drinks, or utensils with other people.  Wash your hands often. Use hand sanitizer when soap and water are not available. What should I do if I have flu symptoms? If you have any flu symptoms, call your health care provider right away. Flu symptoms include:  Fever or chills.  Muscle aches.  Headache.  Sore throat.  Nasal congestion.  Cough.  Feeling tired.  Loss of appetite.  Vomiting.  Diarrhea.  You may be able to take an antiviral medicine to keep the flu  from becoming severe and to shorten how long it lasts. What should I do at home if I am diagnosed with the flu?  Do not take any medicine, including cold or flu medicine, unless directed by your health care provider.  If you take antiviral medicine, make sure you finish it even if you start to feel better.  Drink enough fluid to keep your urine clear or pale yellow.  Get plenty of rest. When would I seek immediate medical care if I have the flu?  You have trouble breathing.  You have chest pain.  You begin to have labor pains.  You have a high fever that does not go down after you take medicine.  You do not feel your baby move.  You have diarrhea or vomiting that will not go away. This information is not intended to replace advice given to you by your health care provider. Make sure you discuss any questions you have with your health care provider. Document Released: 06/09/2008 Document Revised: 01/13/2016 Document Reviewed: 07/04/2013 Elsevier Interactive Patient Education  2017 Elsevier Inc.  

## 2017-06-05 NOTE — Progress Notes (Signed)
ROB: Patient c/o URI symptoms (runny nose, mild occ. cough). Discussed OTC medications safe in pregnancy.  Will hold off on flu vaccine today, to discuss again next visit. Handout given. For random UDS today for h/o MJ in pregnancy.  36 week labs done today.  RTC in 1 week.

## 2017-06-07 LAB — DRUG PROFILE, UR, 9 DRUGS (LABCORP)
Amphetamines, Urine: NEGATIVE ng/mL
Barbiturate Quant, Ur: NEGATIVE ng/mL
Benzodiazepine Quant, Ur: NEGATIVE ng/mL
Cannabinoid Quant, Ur: NEGATIVE ng/mL
Cocaine (Metab.): NEGATIVE ng/mL
Methadone Screen, Urine: NEGATIVE ng/mL
Opiate Quant, Ur: NEGATIVE ng/mL
PCP Quant, Ur: NEGATIVE ng/mL
Propoxyphene: NEGATIVE ng/mL

## 2017-06-08 LAB — STREP GP B NAA: Strep Gp B NAA: POSITIVE — AB

## 2017-06-09 LAB — GC/CHLAMYDIA PROBE AMP
Chlamydia trachomatis, NAA: NEGATIVE
Neisseria gonorrhoeae by PCR: NEGATIVE

## 2017-06-13 ENCOUNTER — Ambulatory Visit (INDEPENDENT_AMBULATORY_CARE_PROVIDER_SITE_OTHER): Payer: Medicaid Other | Admitting: Obstetrics and Gynecology

## 2017-06-13 ENCOUNTER — Telehealth: Payer: Self-pay | Admitting: Obstetrics and Gynecology

## 2017-06-13 VITALS — BP 94/62 | HR 99 | Wt 169.1 lb

## 2017-06-13 DIAGNOSIS — Z3483 Encounter for supervision of other normal pregnancy, third trimester: Secondary | ICD-10-CM

## 2017-06-13 LAB — POCT URINALYSIS DIPSTICK
Bilirubin, UA: NEGATIVE
Blood, UA: NEGATIVE
Glucose, UA: NEGATIVE
Ketones, UA: NEGATIVE
Leukocytes, UA: NEGATIVE
Nitrite, UA: NEGATIVE
Spec Grav, UA: 1.02 (ref 1.010–1.025)
Urobilinogen, UA: 0.2 E.U./dL
pH, UA: 6 (ref 5.0–8.0)

## 2017-06-13 NOTE — Telephone Encounter (Signed)
Patient called and stated that she has some questions and concerns in regards to bleeding after her cervix exam today 06/13/17, No other information was disclosed other than wanting to speak with a nurse. Please advise.

## 2017-06-13 NOTE — Progress Notes (Signed)
ROB: Refused flu shot.  URI resolved.  Denies contractions.  Drug screen negative.  GBS positive.-Discussed with patient.

## 2017-06-13 NOTE — Telephone Encounter (Signed)
Spoke with pt- she states after cervical check this AM she noticed some spotting. Reassured this is normal after a cervical check but to call back if bleeding becomes heavier and/ or she has accompanying cramping/contracting. Pt expressed understanding.

## 2017-06-20 ENCOUNTER — Ambulatory Visit (INDEPENDENT_AMBULATORY_CARE_PROVIDER_SITE_OTHER): Payer: Medicaid Other | Admitting: Obstetrics and Gynecology

## 2017-06-20 ENCOUNTER — Encounter: Payer: Self-pay | Admitting: Obstetrics and Gynecology

## 2017-06-20 VITALS — BP 99/65 | HR 91 | Wt 169.0 lb

## 2017-06-20 DIAGNOSIS — Z3483 Encounter for supervision of other normal pregnancy, third trimester: Secondary | ICD-10-CM

## 2017-06-20 LAB — POCT URINALYSIS DIPSTICK
Bilirubin, UA: NEGATIVE
Glucose, UA: NEGATIVE
Ketones, UA: NEGATIVE
Leukocytes, UA: NEGATIVE
Nitrite, UA: NEGATIVE
Protein, UA: NEGATIVE
Spec Grav, UA: 1.015
Urobilinogen, UA: 0.2 U/dL
pH, UA: 7

## 2017-06-20 NOTE — Progress Notes (Signed)
ROB- pt is doing ok, just very fatigue and has pelvic pressure  Declines-flu vaccine

## 2017-06-20 NOTE — Progress Notes (Signed)
ROB: Notes fatigue.  Increased pelvic pressure.  Given labor precautions. RTC in 1 week.

## 2017-06-27 ENCOUNTER — Ambulatory Visit (INDEPENDENT_AMBULATORY_CARE_PROVIDER_SITE_OTHER): Payer: Medicaid Other | Admitting: Obstetrics and Gynecology

## 2017-06-27 ENCOUNTER — Encounter: Payer: Self-pay | Admitting: Obstetrics and Gynecology

## 2017-06-27 VITALS — BP 104/65 | HR 106 | Wt 172.3 lb

## 2017-06-27 DIAGNOSIS — O480 Post-term pregnancy: Secondary | ICD-10-CM

## 2017-06-27 DIAGNOSIS — Z3483 Encounter for supervision of other normal pregnancy, third trimester: Secondary | ICD-10-CM

## 2017-06-27 LAB — POCT URINALYSIS DIPSTICK
Bilirubin, UA: NEGATIVE
Blood, UA: NEGATIVE
Glucose, UA: NEGATIVE
Ketones, UA: NEGATIVE
Leukocytes, UA: NEGATIVE
Nitrite, UA: NEGATIVE
Protein, UA: NEGATIVE
Spec Grav, UA: 1.02 (ref 1.010–1.025)
Urobilinogen, UA: 0.2 E.U./dL
pH, UA: 5 (ref 5.0–8.0)

## 2017-06-27 NOTE — Progress Notes (Signed)
ROB: No complaints - no contractions.  Begin NST and BPP as scheduled Thursday Monday . Induction of labor discussed scheduled for Wednesday.  S&S reviewed.

## 2017-06-28 ENCOUNTER — Encounter: Payer: Self-pay | Admitting: Obstetrics and Gynecology

## 2017-06-28 ENCOUNTER — Other Ambulatory Visit: Payer: Medicaid Other

## 2017-06-28 ENCOUNTER — Ambulatory Visit (INDEPENDENT_AMBULATORY_CARE_PROVIDER_SITE_OTHER): Payer: Medicaid Other | Admitting: Obstetrics and Gynecology

## 2017-06-28 ENCOUNTER — Other Ambulatory Visit: Payer: Self-pay | Admitting: Obstetrics and Gynecology

## 2017-06-28 DIAGNOSIS — O480 Post-term pregnancy: Secondary | ICD-10-CM

## 2017-06-28 DIAGNOSIS — Z3483 Encounter for supervision of other normal pregnancy, third trimester: Secondary | ICD-10-CM

## 2017-06-28 DIAGNOSIS — Z369 Encounter for antenatal screening, unspecified: Secondary | ICD-10-CM

## 2017-06-28 NOTE — Progress Notes (Signed)
NONSTRESS TEST INTERPRETATION  INDICATIONS: post-dates pregnancy FHR baseline: 135  RESULTS:  reactive COMMENTS:   PLAN: 1. Continue fetal kick counts as directed. 2. Continue antepartum testing as scheduled with BPP Monday Elonda Huskyavid J. Evans, M.D. 06/28/2017 11:15 AM

## 2017-07-02 ENCOUNTER — Ambulatory Visit (INDEPENDENT_AMBULATORY_CARE_PROVIDER_SITE_OTHER): Payer: Medicaid Other | Admitting: Obstetrics and Gynecology

## 2017-07-02 ENCOUNTER — Ambulatory Visit (INDEPENDENT_AMBULATORY_CARE_PROVIDER_SITE_OTHER): Payer: Medicaid Other

## 2017-07-02 ENCOUNTER — Encounter: Payer: Self-pay | Admitting: Obstetrics and Gynecology

## 2017-07-02 VITALS — BP 98/64 | HR 93 | Wt 173.8 lb

## 2017-07-02 DIAGNOSIS — Z3483 Encounter for supervision of other normal pregnancy, third trimester: Secondary | ICD-10-CM

## 2017-07-02 DIAGNOSIS — O480 Post-term pregnancy: Secondary | ICD-10-CM

## 2017-07-02 DIAGNOSIS — Z369 Encounter for antenatal screening, unspecified: Secondary | ICD-10-CM

## 2017-07-02 LAB — POCT URINALYSIS DIPSTICK
Bilirubin, UA: NEGATIVE
Glucose, UA: NEGATIVE
Ketones, UA: NEGATIVE
Nitrite, UA: NEGATIVE
Spec Grav, UA: 1.025 (ref 1.010–1.025)
Urobilinogen, UA: 0.2 E.U./dL
pH, UA: 6.5 (ref 5.0–8.0)

## 2017-07-02 NOTE — Progress Notes (Signed)
ROB- Pt has been having lots of pressure, no complaints, had u/s today

## 2017-07-02 NOTE — Progress Notes (Signed)
ROB: Patient doing well, no complaints. BPP today wnl for post-dates pregnancy.  Scheduled for IOL on Wednesday.  Reiterated labor precautions, discussed plans for pain management in labor.

## 2017-07-02 NOTE — Addendum Note (Signed)
Addended by: Garfield CorneaMABRY, Cynthia Stainback L on: 07/02/2017 11:48 AM   Modules accepted: Orders

## 2017-07-03 ENCOUNTER — Encounter: Payer: Self-pay | Admitting: *Deleted

## 2017-07-03 ENCOUNTER — Other Ambulatory Visit: Payer: Self-pay

## 2017-07-03 ENCOUNTER — Inpatient Hospital Stay
Admission: EM | Admit: 2017-07-03 | Discharge: 2017-07-06 | DRG: 806 | Disposition: A | Discharge status: Home / Self Care | Payer: Medicaid Other | Attending: Obstetrics and Gynecology | Admitting: Obstetrics and Gynecology

## 2017-07-03 DIAGNOSIS — O9952 Diseases of the respiratory system complicating childbirth: Secondary | ICD-10-CM | POA: Diagnosis present

## 2017-07-03 DIAGNOSIS — Z87891 Personal history of nicotine dependence: Secondary | ICD-10-CM

## 2017-07-03 DIAGNOSIS — F1290 Cannabis use, unspecified, uncomplicated: Secondary | ICD-10-CM | POA: Diagnosis present

## 2017-07-03 DIAGNOSIS — Z3A40 40 weeks gestation of pregnancy: Secondary | ICD-10-CM

## 2017-07-03 DIAGNOSIS — O99324 Drug use complicating childbirth: Secondary | ICD-10-CM | POA: Diagnosis present

## 2017-07-03 DIAGNOSIS — O99824 Streptococcus B carrier state complicating childbirth: Secondary | ICD-10-CM | POA: Diagnosis present

## 2017-07-03 DIAGNOSIS — O480 Post-term pregnancy: Principal | ICD-10-CM | POA: Diagnosis present

## 2017-07-03 DIAGNOSIS — J45909 Unspecified asthma, uncomplicated: Secondary | ICD-10-CM | POA: Diagnosis present

## 2017-07-03 DIAGNOSIS — O9081 Anemia of the puerperium: Secondary | ICD-10-CM | POA: Diagnosis not present

## 2017-07-03 LAB — CBC
HCT: 35.1 % (ref 35.0–47.0)
Hemoglobin: 11.9 g/dL — ABNORMAL LOW (ref 12.0–16.0)
MCH: 27.1 pg (ref 26.0–34.0)
MCHC: 33.9 g/dL (ref 32.0–36.0)
MCV: 79.9 fL — ABNORMAL LOW (ref 80.0–100.0)
Platelets: 183 10*3/uL (ref 150–440)
RBC: 4.39 MIL/uL (ref 3.80–5.20)
RDW: 14.3 % (ref 11.5–14.5)
WBC: 9 10*3/uL (ref 3.6–11.0)

## 2017-07-03 LAB — TYPE AND SCREEN
ABO/RH(D): A POS
Antibody Screen: NEGATIVE

## 2017-07-03 MED ORDER — OXYCODONE-ACETAMINOPHEN 5-325 MG PO TABS: 1.0000 | ORAL_TABLET | ORAL | Status: DC | PRN

## 2017-07-03 MED ORDER — ONDANSETRON HCL 4 MG/2ML IJ SOLN: 4.0000 mg | Freq: Four times a day (QID) | INTRAMUSCULAR | Status: DC | PRN

## 2017-07-03 MED ORDER — OXYTOCIN 40 UNITS IN LACTATED RINGERS INFUSION - SIMPLE MED: 1.0000 m[IU]/min | INTRAVENOUS | Status: DC

## 2017-07-03 MED ORDER — AMMONIA AROMATIC IN INHA
RESPIRATORY_TRACT | Status: AC
  Filled 2017-07-03: qty 10

## 2017-07-03 MED ORDER — SODIUM CHLORIDE 0.9 % IV SOLN
2.0000 g | Freq: Once | INTRAVENOUS | Status: AC
  Administered 2017-07-03: 2 g via INTRAVENOUS
  Filled 2017-07-03: qty 2000

## 2017-07-03 MED ORDER — OXYTOCIN BOLUS FROM INFUSION
500.0000 mL | Freq: Once | INTRAVENOUS | Status: AC
  Administered 2017-07-04: 500 mL via INTRAVENOUS

## 2017-07-03 MED ORDER — LACTATED RINGERS IV SOLN: INTRAVENOUS | Status: DC

## 2017-07-03 MED ORDER — OXYCODONE-ACETAMINOPHEN 5-325 MG PO TABS: 2.0000 | ORAL_TABLET | ORAL | Status: DC | PRN

## 2017-07-03 MED ORDER — ACETAMINOPHEN 325 MG PO TABS: 650.0000 mg | ORAL_TABLET | ORAL | Status: DC | PRN

## 2017-07-03 MED ORDER — LIDOCAINE HCL (PF) 1 % IJ SOLN
INTRAMUSCULAR | Status: AC
  Filled 2017-07-03: qty 30

## 2017-07-03 MED ORDER — OXYTOCIN 10 UNIT/ML IJ SOLN
INTRAMUSCULAR | Status: AC
  Filled 2017-07-03: qty 2

## 2017-07-03 MED ORDER — OXYTOCIN 40 UNITS IN LACTATED RINGERS INFUSION - SIMPLE MED
2.5000 [IU]/h | INTRAVENOUS | Status: DC
  Filled 2017-07-03: qty 1000

## 2017-07-03 MED ORDER — TERBUTALINE SULFATE 1 MG/ML IJ SOLN: 0.2500 mg | Freq: Once | INTRAMUSCULAR | Status: DC | PRN

## 2017-07-03 MED ORDER — LACTATED RINGERS IV SOLN: 500.0000 mL | INTRAVENOUS | Status: DC | PRN

## 2017-07-03 MED ORDER — SODIUM CHLORIDE 0.9 % IV SOLN: 1.0000 g | INTRAVENOUS | Status: DC

## 2017-07-03 MED ORDER — SOD CITRATE-CITRIC ACID 500-334 MG/5ML PO SOLN: 30.0000 mL | ORAL | Status: DC | PRN

## 2017-07-03 MED ORDER — MISOPROSTOL 200 MCG PO TABS
ORAL_TABLET | ORAL | Status: AC
  Filled 2017-07-03: qty 4

## 2017-07-03 MED ORDER — BUTORPHANOL TARTRATE 2 MG/ML IJ SOLN
1.0000 mg | INTRAMUSCULAR | Status: DC | PRN
  Administered 2017-07-03: 1 mg via INTRAVENOUS
  Filled 2017-07-03: qty 1

## 2017-07-03 MED ORDER — LIDOCAINE HCL (PF) 1 % IJ SOLN
30.0000 mL | INTRAMUSCULAR | Status: DC | PRN
Start: 2017-07-03 — End: 2017-07-04

## 2017-07-03 NOTE — H&P (Signed)
Obstetric History and Physical  Sherry Malone is a 21 y.o. G2P1001 with IUP at 538w6d presenting for complaints of contractions beginning this morning and gradually intensifying. Patient states she has been having  regular, every 2-3 minutes contractions, none vaginal bleeding, intact membranes, with active fetal movement.    Of note, patient was scheduled for IOL tomorrow for post-dates pregnancy.   Prenatal Course Source of Care: [facility] with onset of care at 13 weeks Pregnancy complications or risks: Patient Active Problem List   Diagnosis Date Noted  . Post-dates pregnancy 07/03/2017  . Labor and delivery, indication for care 04/17/2017  . Trichomonal vaginitis during pregnancy in second trimester 01/16/2017  . History of asthma 01/16/2017  . History of urinary infection 01/16/2017  . Marijuana use 01/16/2017  . Family history of retinitis pigmentosa 06/10/2015   She plans to breastfeed She desires oral contraceptives (estrogen/progesterone) for postpartum contraception.   Prenatal labs and studies: ABO, Rh: --/--/PENDING (11/13 2154) Antibody: PENDING (11/13 2154) Rubella: 2.03 (05/04 1343) RPR: Non Reactive (05/04 1343)  HBsAg: Negative (05/04 1343)  HIV:  PENDING (11/13 2154) XBJ:YNWGNFAOGBS:Positive (10/16 1710) 1 hr Glucola normal Genetic screening normal Anatomy US normal   Past Medical History:  Diagnosis Date  . Asthma     Past Surgical History:  Procedure Laterality Date  . NO PAST SURGERIES      OB History  Gravida Para Term Preterm AB Living  2 1 1  0 0 1  SAB TAB Ectopic Multiple Live Births  0 0 0 0 1    # Outcome Date GA Lbr Len/2nd Weight Sex Delivery Anes PTL Lv  2 Current           1 Term 10/26/15 5829w3d 20:15 / 00:26 6 lb 4.2 oz (2.84 kg) F Vag-Forceps EPI  LIV      Social History   Socioeconomic History  . Marital status: Single    Spouse name: None  . Number of children: None  . Years of education: None  . Highest  education level: None  Social Needs  . Financial resource strain: None  . Food insecurity - worry: None  . Food insecurity - inability: None  . Transportation needs - medical: None  . Transportation needs - non-medical: None  Occupational History  . None  Tobacco Use  . Smoking status: Former Smoker    Packs/day: 0.25    Types: Cigarettes    Last attempt to quit: 04/30/2015    Years since quitting: 2.1  . Smokeless tobacco: Never Used  Substance and Sexual Activity  . Alcohol use: No    Comment: rarely  . Drug use: No  . Sexual activity: Yes  Other Topics Concern  . None  Social History Narrative  . None    Family History  Problem Relation Age of Onset  . Hypertension Father   . Diabetes Maternal Grandmother   . Kidney disease Maternal Grandmother   . Gallstones Other     Medications Prior to Admission  Medication Sig Dispense Refill Last Dose  . albuterol (PROVENTIL HFA;VENTOLIN HFA) 108 (90 Base) MCG/ACT inhaler Inhale 1-2 puffs into the lungs every 6 (six) hours as needed.    Past Month at Unknown time  . Prenatal Vit-Fe Fumarate-FA (PRENATAL MULTIVITAMIN) TABS tablet Take 1 tablet by mouth daily at 12 noon.   Past Week at Unknown time    No Known Allergies  Review of Systems: Negative except for what is mentioned in HPI.  Physical Exam:  BP (!) 130/52 (BP Location: Right Arm)   Pulse (!) 115   Temp 98.2 F (36.8 C) (Oral)   Resp 16   Ht 5\' 1"  (1.549 m)   Wt 176 lb (79.8 kg)   LMP 09/20/2016 (Approximate)   BMI 33.25 kg/m  CONSTITUTIONAL: Well-developed, well-nourished female in no acute distress.  HENT:  Normocephalic, atraumatic, External right and left ear normal. Oropharynx is clear and moist EYES: Conjunctivae and EOM are normal. Pupils are equal, round, and reactive to light. No scleral icterus.  NECK: Normal range of motion, supple, no masses SKIN: Skin is warm and dry. No rash noted. Not diaphoretic. No erythema. No pallor. NEUROLOGIC: Alert and  oriented to person, place, and time. Normal reflexes, muscle tone coordination. No cranial nerve deficit noted. PSYCHIATRIC: Normal mood and affect. Normal behavior. Normal judgment and thought content. CARDIOVASCULAR: Normal heart rate noted, regular rhythm RESPIRATORY: Effort and breath sounds normal, no problems with respiration noted ABDOMEN: Soft, nontender, nondistended, gravid. MUSCULOSKELETAL: Normal range of motion. No edema and no tenderness. 2+ distal pulses.  Cervical Exam: Dilatation  3cm   Effacement 80%   Station -2   Presentation: cephalic FHT:  Baseline rate 150 bpm   Variability moderate  Accelerations present   Decelerations none Contractions: Every 2-3 mins   Pertinent Labs/Studies:   Results for orders placed or performed during the hospital encounter of 07/03/17 (from the past 24 hour(s))  Type and screen     Status: None (Preliminary result)   Collection Time: 07/03/17  9:54 PM  Result Value Ref Range   ABO/RH(D) PENDING    Antibody Screen PENDING    Sample Expiration 07/06/2017     Assessment : Sherry Malone is a 21 y.o. G2P1001 at 8523w6d being admitted for labor.  Post-dates pregnancy. GBS positive.   Plan: Labor: Expectant management. Augmentation with Pitocin as ordered as per protocol if needed. Analgesia as needed. Can receive epidural. Admission labs ordered. FWB: Reassuring fetal heart tracing.  GBS positive. Will treat with ampicillin.  Delivery plan: Hopeful for vaginal delivery   Hildred Laserherry, Caretha Rumbaugh, MD [facility]

## 2017-07-03 NOTE — Progress Notes (Signed)
Intrapartum Progress Note  S: Patient complains of severe pain with contractions. Just received Stadol injection not long ago.  O: Blood pressure (!) 130/52, pulse (!) 115, temperature 98.2 F (36.8 C), temperature source Oral, resp. rate 16, height 5\' 1"  (1.549 m), weight 176 lb (79.8 kg), last menstrual period 09/20/2016, not currently breastfeeding. Gen App: NAD, comfortable Abdomen: soft, gravid FHT: baseline 135 bpm.  Accels present.  Decels absent. moderate in degree variability.   Tocometer: contractions q 2-3 minutes Cervix:  8.5/90/0 to +1 Extremities: Nontender, no edema.  Pitocin: None  Labs:  Results for orders placed or performed during the hospital encounter of 07/03/17  CBC  Result Value Ref Range   WBC 9.0 3.6 - 11.0 K/uL   RBC 4.39 3.80 - 5.20 MIL/uL   Hemoglobin 11.9 (L) 12.0 - 16.0 g/dL   HCT 40.935.1 81.135.0 - 91.447.0 %   MCV 79.9 (L) 80.0 - 100.0 fL   MCH 27.1 26.0 - 34.0 pg   MCHC 33.9 32.0 - 36.0 g/dL   RDW 78.214.3 95.611.5 - 21.314.5 %   Platelets 183 150 - 440 K/uL  Type and screen  Result Value Ref Range   ABO/RH(D) A POS    Antibody Screen NEG    Sample Expiration 07/06/2017    Other admission labs pending.     Assessment:  1: SIUP at 1361w6d 2. Postdates pregnancy 3. GBS+ 4. Precipitous labor  Plan:  1. Patient has received 1 dose of ampicillin for GBS + status.  2. Anticipating vaginal delivery soon.     Hildred Laserherry, Vollie Brunty, MD 07/03/2017 11:23 PM

## 2017-07-04 ENCOUNTER — Encounter: Payer: Self-pay | Admitting: *Deleted

## 2017-07-04 DIAGNOSIS — Z3A40 40 weeks gestation of pregnancy: Secondary | ICD-10-CM

## 2017-07-04 DIAGNOSIS — O480 Post-term pregnancy: Principal | ICD-10-CM

## 2017-07-04 LAB — CBC
HCT: 31.6 % — ABNORMAL LOW (ref 35.0–47.0)
Hemoglobin: 10.7 g/dL — ABNORMAL LOW (ref 12.0–16.0)
MCH: 27.3 pg (ref 26.0–34.0)
MCHC: 33.7 g/dL (ref 32.0–36.0)
MCV: 81 fL (ref 80.0–100.0)
Platelets: 139 10*3/uL — ABNORMAL LOW (ref 150–440)
RBC: 3.9 MIL/uL (ref 3.80–5.20)
RDW: 14.3 % (ref 11.5–14.5)
WBC: 9.9 10*3/uL (ref 3.6–11.0)

## 2017-07-04 LAB — CHLAMYDIA/NGC RT PCR (ARMC ONLY)
Chlamydia Tr: NOT DETECTED
N gonorrhoeae: NOT DETECTED

## 2017-07-04 MED ORDER — PRENATAL MULTIVITAMIN CH
1.0000 | ORAL_TABLET | Freq: Every day | ORAL | Status: DC
  Administered 2017-07-04 – 2017-07-06 (×3): 1 via ORAL
  Filled 2017-07-04 (×3): qty 1

## 2017-07-04 MED ORDER — DIBUCAINE 1 % RE OINT: 1.0000 "application " | TOPICAL_OINTMENT | RECTAL | Status: DC | PRN

## 2017-07-04 MED ORDER — DIPHENHYDRAMINE HCL 25 MG PO CAPS: 25.0000 mg | ORAL_CAPSULE | Freq: Four times a day (QID) | ORAL | Status: DC | PRN

## 2017-07-04 MED ORDER — ONDANSETRON HCL 4 MG PO TABS: 4.0000 mg | ORAL_TABLET | ORAL | Status: DC | PRN

## 2017-07-04 MED ORDER — IBUPROFEN 800 MG PO TABS
800.0000 mg | ORAL_TABLET | Freq: Four times a day (QID) | ORAL | Status: DC
  Administered 2017-07-04 – 2017-07-05 (×5): 800 mg via ORAL
  Filled 2017-07-04 (×5): qty 1

## 2017-07-04 MED ORDER — METHYLERGONOVINE MALEATE 0.2 MG/ML IJ SOLN: 0.2000 mg | INTRAMUSCULAR | Status: DC | PRN

## 2017-07-04 MED ORDER — WITCH HAZEL-GLYCERIN EX PADS: 1.0000 "application " | MEDICATED_PAD | CUTANEOUS | Status: DC | PRN

## 2017-07-04 MED ORDER — BENZOCAINE-MENTHOL 20-0.5 % EX AERO: 1.0000 "application " | INHALATION_SPRAY | CUTANEOUS | Status: DC | PRN

## 2017-07-04 MED ORDER — ZOLPIDEM TARTRATE 5 MG PO TABS: 5.0000 mg | ORAL_TABLET | Freq: Every evening | ORAL | Status: DC | PRN

## 2017-07-04 MED ORDER — SENNOSIDES-DOCUSATE SODIUM 8.6-50 MG PO TABS
2.0000 | ORAL_TABLET | ORAL | Status: DC
  Administered 2017-07-05: 2 via ORAL
  Filled 2017-07-04: qty 2

## 2017-07-04 MED ORDER — ONDANSETRON HCL 4 MG/2ML IJ SOLN: 4.0000 mg | INTRAMUSCULAR | Status: DC | PRN

## 2017-07-04 MED ORDER — METHYLERGONOVINE MALEATE 0.2 MG PO TABS: 0.2000 mg | ORAL_TABLET | ORAL | Status: DC | PRN

## 2017-07-04 MED ORDER — COCONUT OIL OIL
1.0000 "application " | TOPICAL_OIL | Status: DC | PRN
  Filled 2017-07-04: qty 120

## 2017-07-04 MED ORDER — ACETAMINOPHEN 325 MG PO TABS
650.0000 mg | ORAL_TABLET | ORAL | Status: DC | PRN
  Administered 2017-07-04: 650 mg via ORAL
  Filled 2017-07-04: qty 2

## 2017-07-04 MED ORDER — SIMETHICONE 80 MG PO CHEW: 80.0000 mg | CHEWABLE_TABLET | ORAL | Status: DC | PRN

## 2017-07-05 LAB — RPR: RPR Ser Ql: NONREACTIVE

## 2017-07-05 MED ORDER — IBUPROFEN 800 MG PO TABS: 800.0000 mg | ORAL_TABLET | Freq: Three times a day (TID) | ORAL | 0 refills | Status: DC | PRN

## 2017-07-05 MED ORDER — IBUPROFEN 800 MG PO TABS
800.0000 mg | ORAL_TABLET | Freq: Four times a day (QID) | ORAL | Status: DC
  Administered 2017-07-05 – 2017-07-06 (×5): 800 mg via ORAL
  Filled 2017-07-05 (×5): qty 1

## 2017-07-05 NOTE — Progress Notes (Signed)
Post Partum Day # 1, s/p SVD  Subjective: no complaints, up ad lib, voiding, tolerating PO and + flatus and BM.  Objective: Temp:  [97.9 F (36.6 C)-98.3 F (36.8 C)] 98 F (36.7 C) (11/15 0824) Pulse Rate:  [69-93] 69 (11/15 0824) Resp:  [18] 18 (11/15 0824) BP: (97-113)/(66-73) 113/73 (11/15 0824) SpO2:  [98 %-99 %] 99 % (11/15 0824)  Physical Exam:  General: alert and no distress  Lungs: clear to auscultation bilaterally Breasts: normal appearance, no masses or tenderness Heart: regular rate and rhythm, S1, S2 normal, no murmur, click, rub or gallop Abdomen: soft, non-tender; bowel sounds normal; no masses,  no organomegaly Pelvis: Lochia: appropriate, Uterine Fundus: firm Extremities: DVT Evaluation: No evidence of DVT seen on physical exam. Negative Homan's sign. No cords or calf tenderness. No significant calf/ankle edema.  Recent Labs    07/03/17 2154 07/04/17 1025  HGB 11.9* 10.7*  HCT 35.1 31.6*    Assessment/Plan: Doing well postpartum Breastfeeding, notes occasional difficulties with latching.  Has been seen by Lactation consultant.  Contraception: patient has changed her mind about OCPs and now wants Mirena IUD Mild anemia postpartum, asymptomatic. Does not require treatment.  Plan for discharge tomorrow   LOS: 2 days   Hildred Laserherry, Krisandra Bueno, MD [facility] 07/05/2017 10:24 AM

## 2017-07-05 NOTE — Discharge Summary (Signed)
Obstetric Discharge Summary Reason for Admission: onset of labor at 40.[redacted] weeks gestation Prenatal Procedures: none Intrapartum Procedures: spontaneous vaginal delivery Postpartum Procedures: none Complications-Operative and Postpartum: none Hemoglobin  Date Value Ref Range Status  07/04/2017 10.7 (L) 12.0 - 16.0 g/dL Final  09/81/191408/28/2018 78.211.3 11.1 - 15.9 g/dL Final  95/62/130812/04/2015 65.711.4 g/dL Final   HCT  Date Value Ref Range Status  07/04/2017 31.6 (L) 35.0 - 47.0 % Final  07/30/2015 34 % Final   Hematocrit  Date Value Ref Range Status  04/17/2017 34.3 34.0 - 46.6 % Final    Physical Exam:  General: alert and no distress Lochia: appropriate Uterine Fundus: firm Incision: none DVT Evaluation: No evidence of DVT seen on physical exam. Negative Homan's sign. No cords or calf tenderness. No significant calf/ankle edema.  Discharge Diagnoses: Term Pregnancy-delivered  Discharge Information: Date: 07/05/2017 Activity: pelvic rest Diet: routine Medications: PNV and Ibuprofen Condition: stable Instructions: refer to practice specific booklet Discharge to: home   Newborn Data: Live born female  Birth Weight: 7 lb 0.9 oz (3200 g) APGAR: 8, 9  Newborn Delivery   Birth date/time:  07/04/2017 00:03:00 Delivery type:  Vaginal, Spontaneous     Home with mother.  Sherry Malone 07/05/2017, 10:28 AM

## 2017-07-06 NOTE — Progress Notes (Signed)
Pt discharged with infant.  Discharge instructions, prescriptions and follow up appointment given to and reviewed with pt. Pt verbalized understanding. Escorted out by auxillary. 

## 2017-08-22 ENCOUNTER — Encounter: Payer: Self-pay | Admitting: Obstetrics and Gynecology

## 2017-08-22 ENCOUNTER — Ambulatory Visit (INDEPENDENT_AMBULATORY_CARE_PROVIDER_SITE_OTHER): Payer: Medicaid Other | Admitting: Obstetrics and Gynecology

## 2017-08-22 DIAGNOSIS — O9081 Anemia of the puerperium: Secondary | ICD-10-CM

## 2017-08-22 NOTE — Progress Notes (Signed)
   OBSTETRICS POSTPARTUM CLINIC PROGRESS NOTE  Subjective:     Sherry Malone is a 22 y.o. 432P2002 female who presents for a postpartum visit. She is 6 weeks postpartum following a spontaneous vaginal delivery. I have fully reviewed the prenatal and intrapartum course. The delivery was at 40.6 gestational weeks.  Anesthesia: IV sedation. Postpartum course has been well. Baby's course has been well. Baby is feeding by breast. Bleeding: patient has not resumed menses, with No LMP recorded. Bowel function is normal. Bladder function is normal. Patient is sexually active, used condoms (inconsistently). Contraception method desired is IUD. Postpartum depression screening: negative.  The following portions of the patient's history were reviewed and updated as appropriate: allergies, current medications, past family history, past medical history, past social history, past surgical history and problem list.  Review of Systems Pertinent items noted in HPI and remainder of comprehensive ROS otherwise negative.   Objective:    BP (!) 97/59   Pulse 74   Ht 5\' 1"  (1.549 m)   Wt 163 lb 11.2 oz (74.3 kg)   Breastfeeding? Yes   BMI 30.93 kg/m   General:  alert and no distress   Breasts:  inspection negative, no nipple discharge or bleeding, no masses or nodularity palpable  Lungs: clear to auscultation bilaterally  Heart:  regular rate and rhythm, S1, S2 normal, no murmur, click, rub or gallop  Abdomen: soft, non-tender; bowel sounds normal; no masses,  no organomegaly.    Vulva:  normal  Vagina: normal vagina, no discharge, exudate, lesion, or erythema  Cervix:  no cervical motion tenderness and no lesions  Corpus: normal size, contour, position, consistency, mobility, non-tender  Adnexa:  normal adnexa and no mass, fullness, tenderness  Rectal Exam: Not performed.         Labs:  Lab Results  Component Value Date   HGB 10.7 (L) 07/04/2017     Assessment:    Routine postpartum  exam s/p SVD.    Mild postpartum anemia  Plan:    1. Contraception: IUD.  Patient had recent unprotected coitus 2 days ago.  Advised on abstinence or protected intercourse x 1-2 weeks, can place IUD at that time. Desires Mirena IUD.  2. Mild anemia postpartum, asymptomatic.  Hgb was above 10 at discharge. No need for restesting.  3. Follow up in: 1-2 weeks for IUD placement.Hildred Laser.    Romello Hoehn, MD [facility]

## 2017-08-30 ENCOUNTER — Ambulatory Visit: Payer: Medicaid Other | Admitting: Obstetrics and Gynecology

## 2017-09-05 ENCOUNTER — Encounter: Payer: Self-pay | Admitting: Obstetrics and Gynecology

## 2017-09-05 ENCOUNTER — Ambulatory Visit (INDEPENDENT_AMBULATORY_CARE_PROVIDER_SITE_OTHER): Payer: Medicaid Other | Admitting: Obstetrics and Gynecology

## 2017-09-05 VITALS — BP 97/60 | HR 61 | Ht 61.0 in | Wt 162.2 lb

## 2017-09-05 DIAGNOSIS — Z30430 Encounter for insertion of intrauterine contraceptive device: Secondary | ICD-10-CM

## 2017-09-05 LAB — POCT URINE PREGNANCY: Preg Test, Ur: NEGATIVE

## 2017-09-05 NOTE — Patient Instructions (Signed)

## 2017-09-05 NOTE — Progress Notes (Signed)
     GYNECOLOGY OFFICE PROCEDURE NOTE  Sherry Malone is a 22 y.o. [id number] here for Mirena IUD insertion. No GYN concerns.  Last pap smear was on 12/2016 and was normal.  IUD Insertion Procedure Note Patient identified, informed consent performed, consent signed.   Discussed risks of irregular bleeding, cramping, infection, malpositioning or misplacement of the IUD outside the uterus which may require further procedure such as laparoscopy. Time out was performed.  Urine pregnancy test negative.  Speculum placed in the vagina.  Cervix visualized.  Cleaned with Betadine x 2.  Grasped anteriorly with a single tooth tenaculum.  Uterus sounded to 8.5 cm.  Mirena IUD placed per manufacturer's recommendations.  Strings trimmed to 3 cm. Tenaculum was removed, good hemostasis noted.  Patient tolerated procedure well.   Patient was given post-procedure instructions.  She was advised to have backup contraception for one week.  Patient was also asked to check IUD strings periodically and follow up in 4 weeks for IUD check.   Hildred Laserherry, Uno Esau, MD [facility]

## 2017-10-03 ENCOUNTER — Ambulatory Visit (INDEPENDENT_AMBULATORY_CARE_PROVIDER_SITE_OTHER): Payer: Medicaid Other | Admitting: Obstetrics and Gynecology

## 2017-10-03 ENCOUNTER — Encounter: Payer: Self-pay | Admitting: Obstetrics and Gynecology

## 2017-10-03 VITALS — BP 95/60 | HR 87 | Ht 61.0 in | Wt 159.3 lb

## 2017-10-03 DIAGNOSIS — Z30431 Encounter for routine checking of intrauterine contraceptive device: Secondary | ICD-10-CM | POA: Diagnosis not present

## 2017-10-03 DIAGNOSIS — Z975 Presence of (intrauterine) contraceptive device: Secondary | ICD-10-CM | POA: Insufficient documentation

## 2017-10-03 NOTE — Progress Notes (Signed)
Pt stated since the IUD she has been spotting. Also have been cramping since the IUD.

## 2017-10-03 NOTE — Progress Notes (Signed)
     GYNECOLOGY OFFICE PROGRESS NOTE  History:  22 y.o. [id number] here today for today for IUD string check; Mirena IUD was placed 09/05/2017. Patient complains that she is still having some mild cramping and spotting since insertion. Also notes that partner can feel strings.   The following portions of the patient's history were reviewed and updated as appropriate: allergies, current medications, past family history, past medical history, past social history, past surgical history and problem list. Last pap smear on 12/22/2016 was normal, negative HRHPV.  Review of Systems:  Pertinent items are noted in HPI.   Objective:  Physical Exam Blood pressure 95/60, pulse 87, height 5\' 1"  (1.549 m), weight 159 lb 4.8 oz (72.3 kg), currently breastfeeding. CONSTITUTIONAL: Well-developed, well-nourished female in no acute distress.  ABDOMEN: Soft, no distention noted.   PELVIC: Normal appearing external genitalia; normal appearing vaginal mucosa and cervix.  IUD strings visualized, about 4 cm in length outside cervix. EXTREMITIES:  Non-tender, no edema or lesions NEUROLOGIC: Grossly intact  Assessment & Plan:  - Normal IUD check.  Strings cut down to 3 cm.  - Advised that spotting and cramping were normal and should resolve within first 3 months of IUD placement. Can take OTC pain meds as needed.  - Patient to keep IUD in place for five years; can come in for removal if she desires pregnancy within the next five years. - Routine preventative health maintenance measures emphasized.   Hildred Laserherry, Elianne Gubser, MD [facility]

## 2017-12-17 ENCOUNTER — Other Ambulatory Visit: Payer: Self-pay

## 2017-12-17 ENCOUNTER — Ambulatory Visit: Payer: BLUE CROSS/BLUE SHIELD | Admitting: Nurse Practitioner

## 2017-12-17 ENCOUNTER — Encounter: Payer: Self-pay | Admitting: Nurse Practitioner

## 2017-12-17 VITALS — BP 108/58 | HR 79 | Temp 98.6°F | Ht 61.0 in | Wt 155.8 lb

## 2017-12-17 DIAGNOSIS — H6983 Other specified disorders of Eustachian tube, bilateral: Secondary | ICD-10-CM | POA: Diagnosis not present

## 2017-12-17 DIAGNOSIS — Z7689 Persons encountering health services in other specified circumstances: Secondary | ICD-10-CM

## 2017-12-17 DIAGNOSIS — H6501 Acute serous otitis media, right ear: Secondary | ICD-10-CM

## 2017-12-17 MED ORDER — AMOXICILLIN-POT CLAVULANATE 875-125 MG PO TABS: 1.0000 | ORAL_TABLET | Freq: Two times a day (BID) | ORAL | 0 refills | Status: AC

## 2017-12-17 MED ORDER — FLUTICASONE PROPIONATE 50 MCG/ACT NA SUSP: 2.0000 | Freq: Every day | NASAL | 6 refills | Status: DC

## 2017-12-17 NOTE — Patient Instructions (Signed)
Sherry Malone,   Thank you for coming in to clinic today.  1. It sounds like you have an ear infection.  Recommend good hand washing. - START taking Augmentin 875-125 mg tablet twice daily (about every 12 hours) for 10 days.  Make sure to take all doses of your antibiotic. - While you are on an antibiotic, take a probiotic.  Antibiotics kill good and bad bacteria.  A probiotic helps to replace your good bacteria. Probiotic pills can be found over the counter.  One brand is Florastor, but you can use any brand you prefer.  You can also get good bacteria from foods like yogurt. - Start anti-histamine cetirizine (Zyrtec)  daily. - You may also use Flonase 2 sprays each nostril daily for up to 4-6 weeks  Other over the counter medications you may try, if needed for symptoms are: - If congestion is worse, start OTC Mucinex (or may try Mucinex-DM for cough) up to 7-10 days then stop - You may try over the counter Nasal Saline spray (Simply Saline, Ocean Spray) as needed to reduce congestion. - Start taking Tylenol extra strength 1 to 2 tablets every 6-8 hours for aches or fever/chills for next few days as needed.  Do not take more than 3,000 mg in 24 hours from all medicines.  You may also take ibuprofen 200-400mg  every 8 hours as needed.   - Drink warm herbal tea with honey for sore throat.   If symptoms are significantly worse with persistent fevers/chills despite tylenol/ibpurofen, nausea, vomiting unable to tolerate food/fluids or medicine, body aches, or shortness of breath, sinus pain pressure or worsening productive cough, then follow-up for re-evaluation, may seek more immediate care at Urgent Care or the ED if you are more concerned that it is an emergency.  Please schedule a follow-up appointment with Sherry Malone, AGNP. Return in about 3 months (around 03/18/2018) for annual physical, or 5-7 days if symptoms worsen or fail to improve.  If you have any other questions or  concerns, please feel free to call the clinic or send a message through MyChart. You may also schedule an earlier appointment if necessary.  You will receive a survey after today's visit either digitally by e-mail or paper by [employer]. Your experiences and feedback matter to [place].  Please respond so we know how we are doing as we provide care for you.   Sherry Mcardle, DNP, AGNP-BC Adult Gerontology Nurse Practitioner [facility], [facility]   Eustachian Tube Dysfunction The eustachian tube connects the middle ear to the back of the nose. It regulates air pressure in the middle ear by allowing air to move between the ear and nose. It also helps to drain fluid from the middle ear space. When the eustachian tube does not function properly, air pressure, fluid, or both can build up in the middle ear. Eustachian tube dysfunction can affect one or both ears. What are the causes? This condition happens when the eustachian tube becomes blocked or cannot open normally. This may result from:  Ear infections.  Colds and other upper respiratory infections.  Allergies.  Irritation, such as from cigarette smoke or acid from the stomach coming up into the esophagus (gastroesophageal reflux).  Sudden changes in air pressure, such as from descending in an airplane.  Abnormal growths in the nose or throat, such as nasal polyps, tumors, or enlarged tissue at the back of the throat (adenoids).  What increases the risk? This condition may be more likely  to develop in people who smoke and people who are overweight. Eustachian tube dysfunction may also be more likely to develop in children, especially children who have:  Certain birth defects of the mouth, such as cleft palate.  Large tonsils and adenoids.  What are the signs or symptoms? Symptoms of this condition may include:  A feeling of fullness in the ear.  Ear pain.  Clicking or popping noises in the ear.  Ringing in the  ear.  Hearing loss.  Loss of balance.  Symptoms may get worse when the air pressure around you changes, such as when you travel to an area of high elevation or fly on an airplane. How is this diagnosed? This condition may be diagnosed based on:  Your symptoms.  A physical exam of your ear, nose, and throat.  Tests, such as those that measure: ? The movement of your eardrum (tympanogram). ? Your hearing (audiometry).  How is this treated? Treatment depends on the cause and severity of your condition. If your symptoms are mild, you may be able to relieve your symptoms by moving air into ("popping") your ears. If you have symptoms of fluid in your ears, treatment may include:  Decongestants.  Antihistamines.  Nasal sprays or ear drops that contain medicines that reduce swelling (steroids).  In some cases, you may need to have a procedure to drain the fluid in your eardrum (myringotomy). In this procedure, a small tube is placed in the eardrum to:  Drain the fluid.  Restore the air in the middle ear space.  Follow these instructions at home:  Take over-the-counter and prescription medicines only as told by your health care provider.  Use techniques to help pop your ears as recommended by your health care provider. These may include: ? Chewing gum. ? Yawning. ? Frequent, forceful swallowing. ? Closing your mouth, holding your nose closed, and gently blowing as if you are trying to blow air out of your nose.  Do not do any of the following until your health care provider approves: ? Travel to high altitudes. ? Fly in airplanes. ? Work in a Estate agent or room. ? Scuba dive.  Keep your ears dry. Dry your ears completely after showering or bathing.  Do not smoke.  Keep all follow-up visits as told by your health care provider. This is important. Contact a health care provider if:  Your symptoms do not go away after treatment.  Your symptoms come back after  treatment.  You are unable to pop your ears.  You have: ? A fever. ? Pain in your ear. ? Pain in your head or neck. ? Fluid draining from your ear.  Your hearing suddenly changes.  You become very dizzy.  You lose your balance. This information is not intended to replace advice given to you by your health care provider. Make sure you discuss any questions you have with your health care provider. Document Released: 09/03/2015 Document Revised: 01/13/2016 Document Reviewed: 08/26/2014 Elsevier Interactive Patient Education  [employer].

## 2017-12-17 NOTE — Progress Notes (Signed)
Subjective:    Patient ID: Sherry Malone, female    DOB: [birth year], 22 y.o.   MRN: [id number]  Sherry Malone is a 22 y.o. female presenting on 12/17/2017 for Establish Care (bilateral ear fullness, with more discomfort in the right ear, nasal drainage and cough x 1.5 week )   HPI  Establish Care New Provider Pt last seen by PCP many years ago.  Obtain records from [facility] for [facility] for most recent preventive care.    Right Ear Pain Has had ear fullness and throbbing pain that improves some throughout the day.  Has had these symptoms for about 1.5 weeks.  Pt has also had some mild nasal drainage and cough.  About 1 month ago, pt notes she has had symptoms of URI that resolved until new symptoms described above. - Pt denies fever, chills, sweats, nausea, vomiting, diarrhea and constipation.  - No OTC meds have been taken for R ear pain.  Past Medical History:  Diagnosis Date  . Asthma   . History of trichomoniasis 01/16/2017  . Medical history non-contributory    Past Surgical History:  Procedure Laterality Date  . CHOLECYSTECTOMY N/A 05/15/2016   Procedure: LAPAROSCOPIC CHOLECYSTECTOMY;  Surgeon: Leafy Ro, MD;  Location: [facility] ORS;  Service: General;  Laterality: N/A;  . NO PAST SURGERIES     Social History   Socioeconomic History  . Marital status: Single    Spouse name: Not on file  . Number of children: Not on file  . Years of education: Not on file  . Highest education level: Not on file  Occupational History  . Not on file  Social Needs  . Financial resource strain: Not on file  . Food insecurity:    Worry: Not on file    Inability: Not on file  . Transportation needs:    Medical: Not on file    Non-medical: Not on file  Tobacco Use  . Smoking status: Former Smoker    Packs/day: 0.25    Types: Cigarettes    Last attempt to quit: 04/30/2015    Years since quitting: 2.7  . Smokeless tobacco: Never Used  Substance and Sexual  Activity  . Alcohol use: Yes    Comment: rarely  . Drug use: No    Frequency: 9.0 times per week  . Sexual activity: Yes    Birth control/protection: IUD  Lifestyle  . Physical activity:    Days per week: Not on file    Minutes per session: Not on file  . Stress: Not on file  Relationships  . Social connections:    Talks on phone: Not on file    Gets together: Not on file    Attends religious service: Not on file    Active member of club or organization: Not on file    Attends meetings of clubs or organizations: Not on file    Relationship status: Not on file  . Intimate partner violence:    Fear of current or ex partner: Not on file    Emotionally abused: Not on file    Physically abused: Not on file    Forced sexual activity: Not on file  Other Topics Concern  . Not on file  Social History Narrative  . Not on file   Family History  Problem Relation Age of Onset  . Hypertension Father   . Diabetes Maternal Grandmother   . Kidney disease Maternal Grandmother   . Gallstones Other  Current Outpatient Medications on File Prior to Visit  Medication Sig  . albuterol (PROVENTIL HFA;VENTOLIN HFA) 108 (90 Base) MCG/ACT inhaler Inhale 1-2 puffs into the lungs every 6 (six) hours as needed.   Marland Kitchen levonorgestrel (MIRENA) 20 MCG/24HR IUD 1 each by Intrauterine route once.  . Prenatal Vit-Fe Fumarate-FA (PRENATAL MULTIVITAMIN) TABS tablet Take 1 tablet by mouth daily at 12 noon.   No current facility-administered medications on file prior to visit.     Review of Systems  Constitutional: Negative for chills and fever.  HENT: Positive for congestion, ear pain, rhinorrhea, sinus pressure, sinus pain and sore throat. Negative for ear discharge and hearing loss.   Eyes: Negative for pain.  Respiratory: Negative for cough, shortness of breath and wheezing.   Cardiovascular: Negative for chest pain, palpitations and leg swelling.  Gastrointestinal: Negative for abdominal pain, blood  in stool, constipation, diarrhea, nausea and vomiting.  Endocrine: Negative for polydipsia.  Genitourinary: Negative for dysuria, frequency, hematuria and urgency.  Musculoskeletal: Negative for back pain, myalgias and neck pain.  Skin: Negative.  Negative for rash.  Allergic/Immunologic: Positive for environmental allergies.  Neurological: Negative for dizziness, weakness and headaches.  Hematological: Does not bruise/bleed easily.  Psychiatric/Behavioral: Negative for dysphoric mood and suicidal ideas. The patient is not nervous/anxious.    Per HPI unless specifically indicated above     Objective:    BP (!) 108/58 (BP Location: Left Arm, Patient Position: Sitting, Cuff Size: Normal)   Pulse 79   Temp 98.6 F (37 C) (Oral)   Ht  (1.549 m)   Wt 155 lb 12.8 oz (70.7 kg)   SpO2 99%   Breastfeeding? Yes   BMI 29.44 kg/m   Wt Readings from Last 3 Encounters:  12/17/17 155 lb 12.8 oz (70.7 kg)  10/03/17 159 lb 4.8 oz (72.3 kg)  09/05/17 162 lb 3.2 oz (73.6 kg)    Physical Exam  Constitutional: She appears well-developed and well-nourished. She appears distressed (mildly).  HENT:  Head: Normocephalic and atraumatic.  Right Ear: Hearing, external ear and ear canal normal. There is tenderness. No mastoid tenderness. Tympanic membrane is erythematous and bulging. Tympanic membrane is not injected, not scarred and not perforated. A middle ear effusion is present.  Left Ear: Hearing, external ear and ear canal normal. No tenderness. No mastoid tenderness. Tympanic membrane is bulging. Tympanic membrane is not injected, not scarred, not perforated and not erythematous.  No middle ear effusion.  Nose: Rhinorrhea present. No mucosal edema. Right sinus exhibits maxillary sinus tenderness and frontal sinus tenderness. Left sinus exhibits maxillary sinus tenderness and frontal sinus tenderness.  Mouth/Throat: Uvula is midline, oropharynx is clear and moist and mucous membranes are normal.    Neck: Normal range of motion. Neck supple.  Cardiovascular: Normal rate, regular rhythm, S1 normal, S2 normal, normal heart sounds and intact distal pulses.  Pulmonary/Chest: Effort normal and breath sounds normal. No respiratory distress.  Lymphadenopathy:    She has cervical adenopathy.  Neurological: She is alert.  Skin: Skin is warm and dry.  Psychiatric: She has a normal mood and affect. Her behavior is normal.  Vitals reviewed.    Results for orders placed or performed in visit on 09/05/17  POCT urine pregnancy  Result Value Ref Range   Preg Test, Ur Negative Negative      Assessment & Plan:   Problem List Items Addressed This Visit    None    Visit Diagnoses    Non-recurrent acute serous otitis media of  right ear    -  Primary   Dysfunction of both eustachian tubes       Relevant Medications   fluticasone (FLONASE) 50 MCG/ACT nasal spray   Encounter to establish care         #1 AOM, Eustachian tube dysfunction:  Consistent with bilateral eustachian tube dysfunction and R AOM on exam with effusion and without perforation, No recent URI or antibiotics. No prior known AOM. Currently afebrile, well-appearing and non-toxic, well hydrated on exam.  Plan: 1. Start Augmentin 875-125 mg one tablet bid x 10 days. - Start anti-histamine Cetirizine  daily,  - START Flonase 2 sprays each nostril daily for up to 4-6 weeks 2. Supportive care with nasal saline, warm herbal tea with honey, 3. Improve hydration 4. Tylenol / Motrin PRN fevers 5. Return criteria given  2. Regular Children's Motrin for 2-3 days then PRN q 6 hr 3. Improve hydration, regular diet as tolerated 4. For cough, warm camomile tea with honey 6. Return criteria given  #2 Establish care Previous care received at [facility].  Records reviewed in care Everywhere.  Past medical, family, and surgical history reviewed w/ pt.    Meds ordered this encounter  Medications  .  amoxicillin-clavulanate (AUGMENTIN) 875-125 MG tablet    Sig: Take 1 tablet by mouth 2 (two) times daily for 10 days.    Dispense:  20 tablet    Refill:  0    Order Specific Question:   Supervising Provider    Answer:   Smitty Cords [[id number]]  . fluticasone (FLONASE) 50 MCG/ACT nasal spray    Sig: Place 2 sprays into both nostrils daily.    Dispense:  16 g    Refill:  6    Order Specific Question:   Supervising Provider    Answer:   Smitty Cords [[id number]]     Follow up plan: Return in about 3 months (around 03/18/2018), or 5-7 days if symptoms worsen or fail to improve, for annual physical.  [name], DNP, AGPCNP-BC Adult Gerontology Primary Care Nurse Practitioner [facility] 01/14/2018, 7:26 PM

## 2018-01-08 IMAGING — US US OB LIMITED
1 series · 14 of 28 positions shown · non-contrast
Comparison: none

CLINICAL DATA: Pregnant patient in second trimester pregnancy with
pelvic pain.

EXAM:
LIMITED OBSTETRIC ULTRASOUND

[Series 1: us ob limited · 0.23mm/px · 14 of 30 slices shown]
[im 2/30]
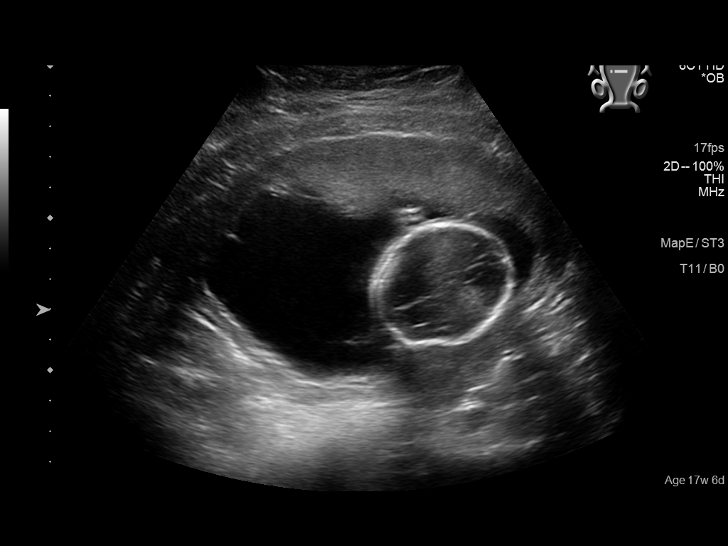
[im 4/30]
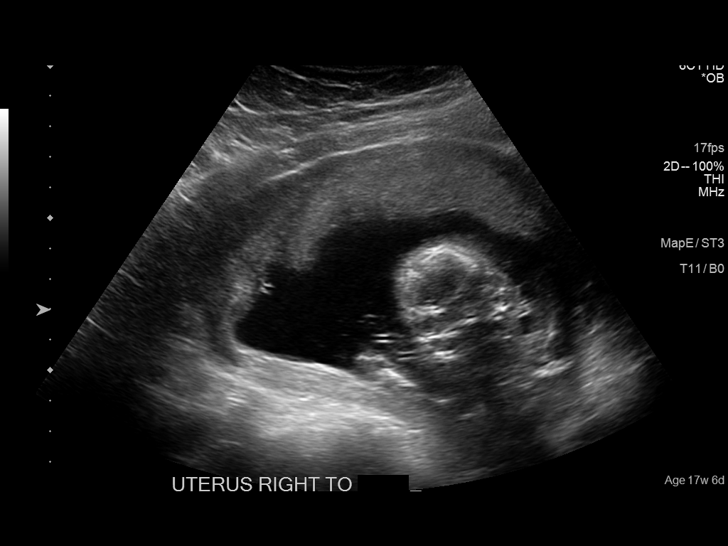
[im 6/30]
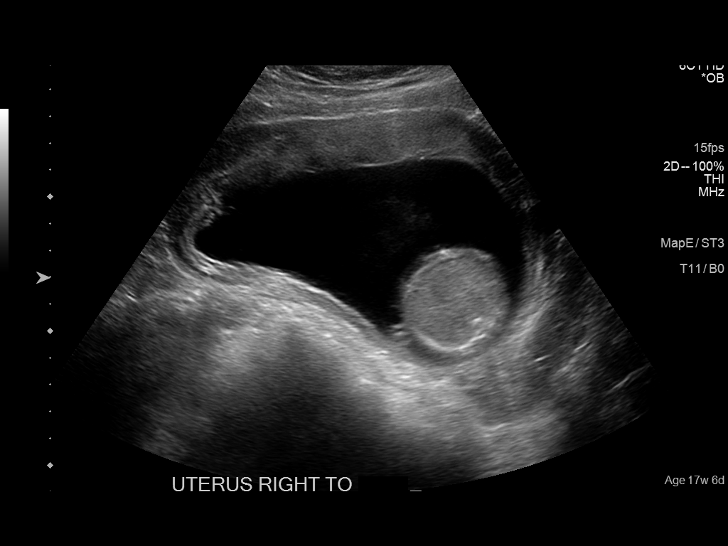
[im 8/30]
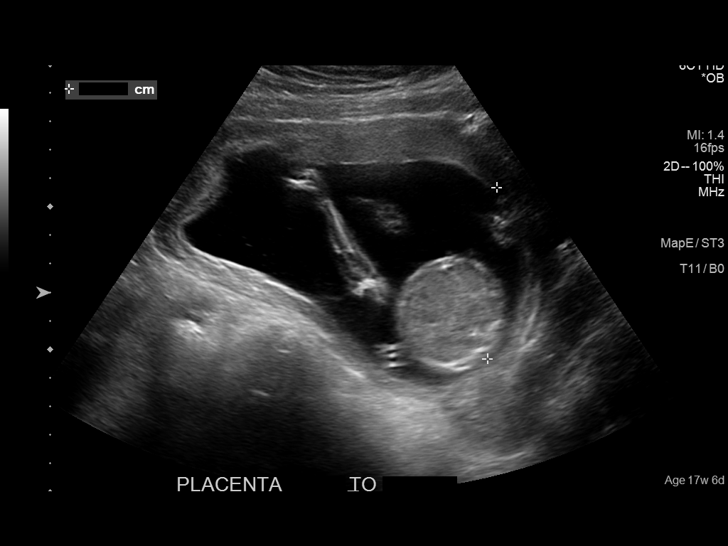
[im 10/30]
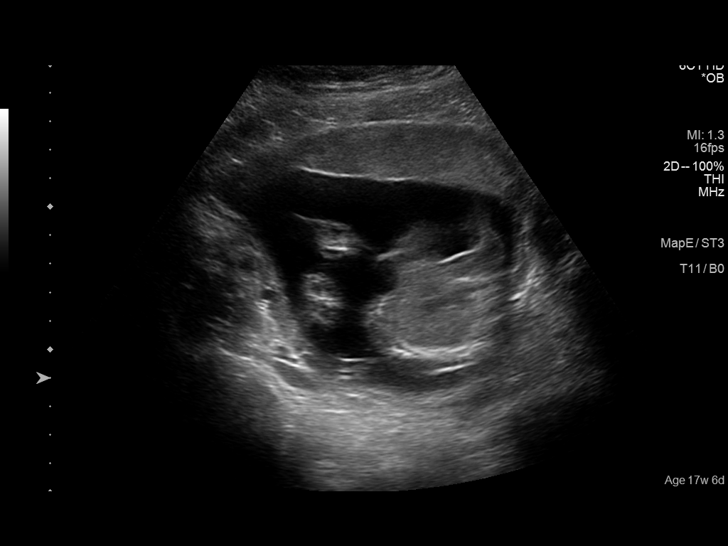
[im 12/30]
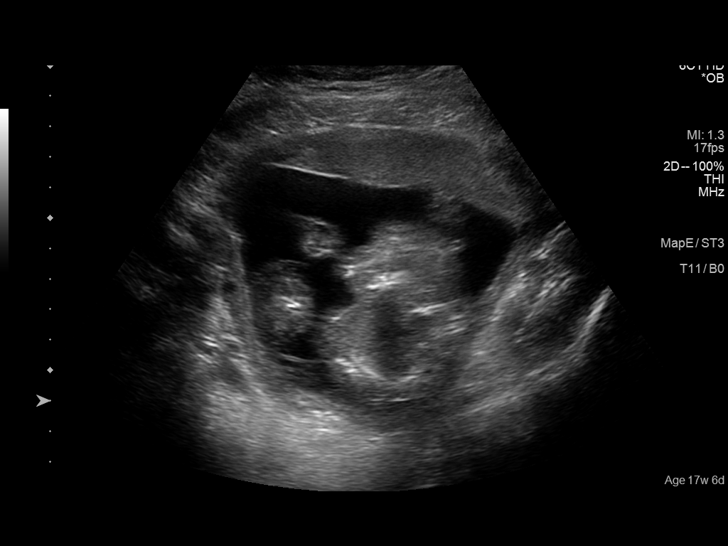
[im 14/30]
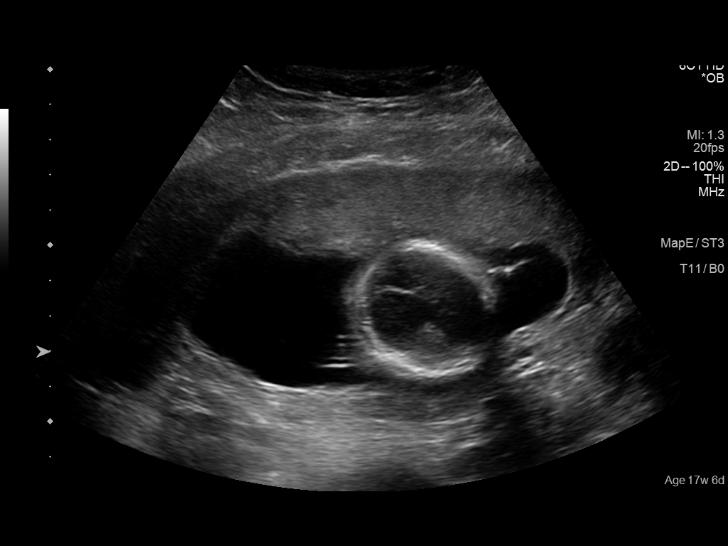
[im 17/30]
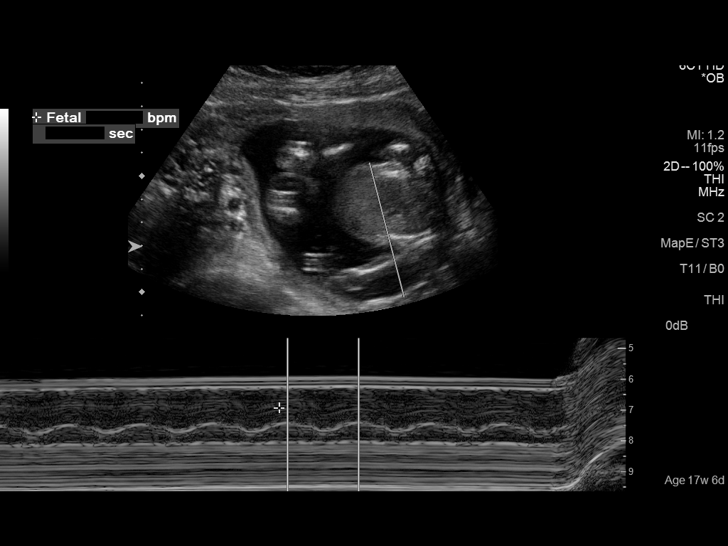
[im 19/30]
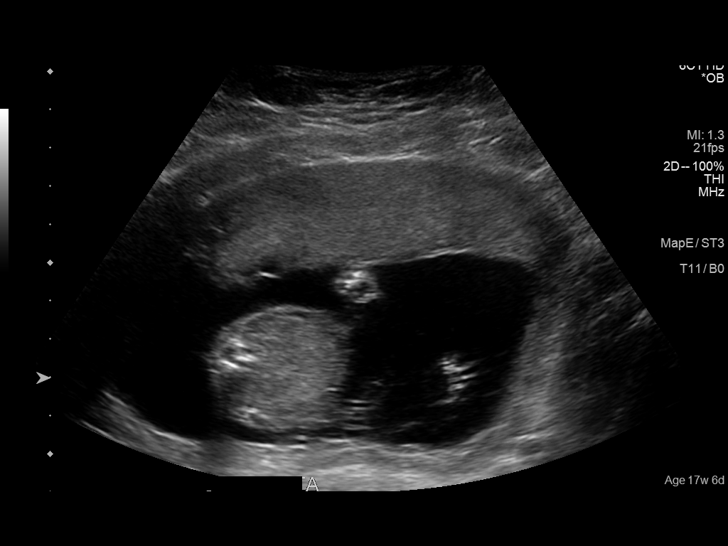
[im 21/30]
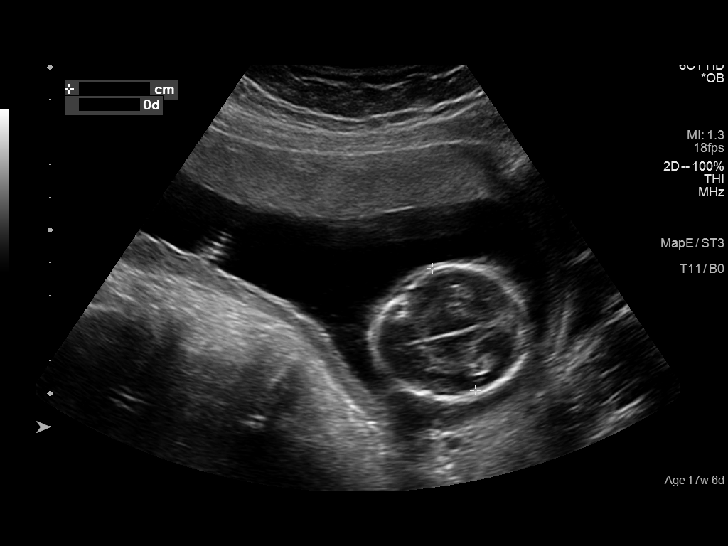
[im 23/30]
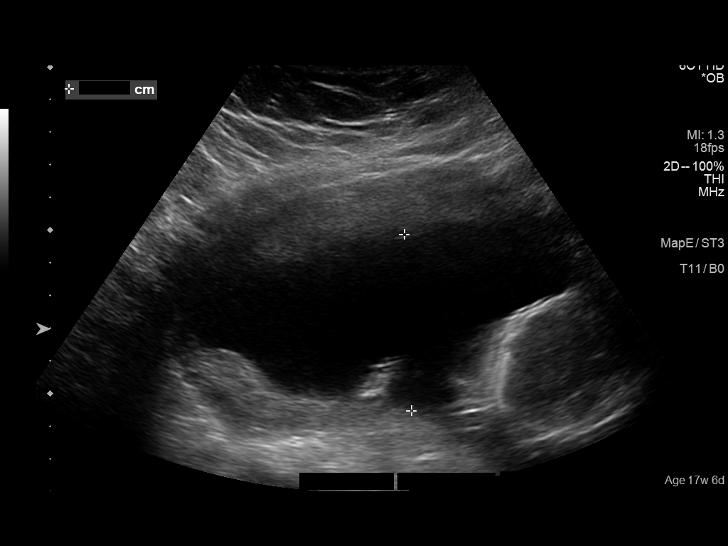
[im 25/30]
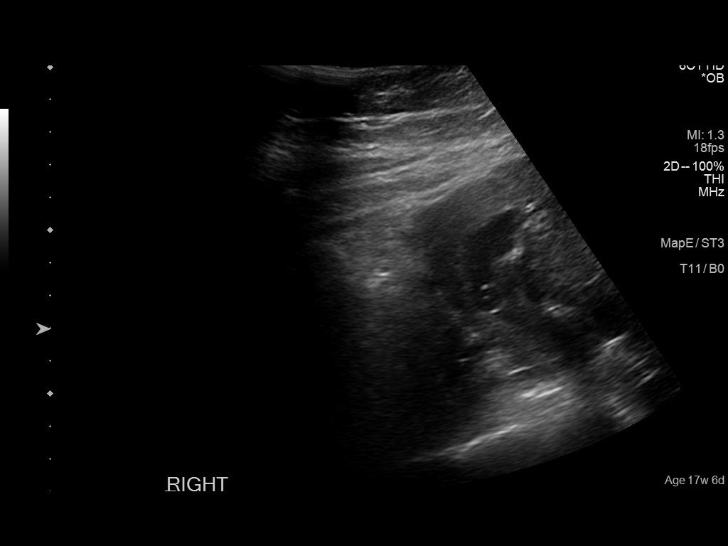
[im 27/30]
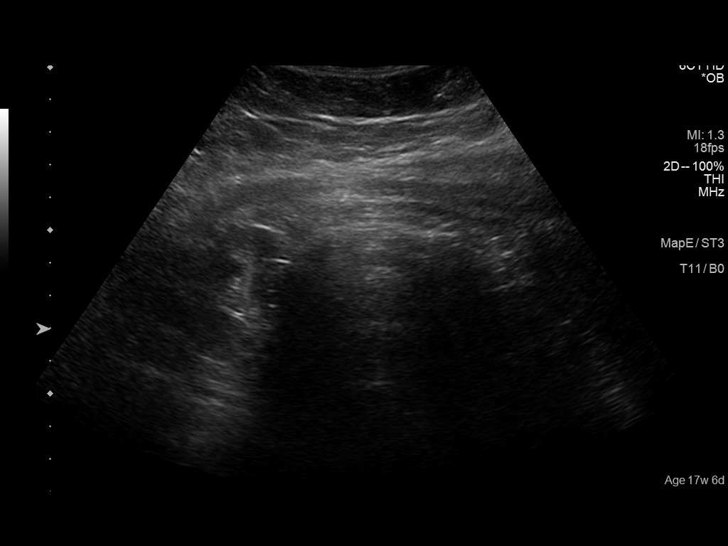
[im 30/30]
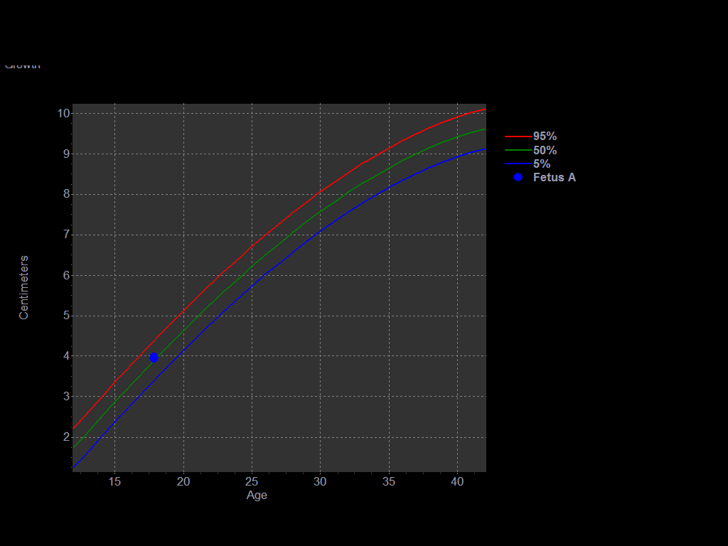

[14 of 28 positions shown; findings below may reference images not displayed]

FINDINGS: Number of Fetuses: 1

Heart Rate:  144 bpm

Movement: Yes

Presentation: Cephalic

Placental Location: Anterior

Previa: No.

Amniotic Fluid (Subjective):  Within normal limits.

BPD:  3.95cm 18w  0d

MATERNAL FINDINGS:

Cervix:  Appears closed.

Uterus/Adnexae: No abnormality visualized. Neither ovary was
discretely seen.
IMPRESSION: Single live intrauterine pregnancy estimated gestational age 18
weeks 0 days for estimated date of delivery 06/26/2017. No evident
complications.

This exam is performed on an emergent basis and does not
comprehensively evaluate fetal size, dating, or anatomy; follow-up
complete OB US should be considered if further fetal assessment is
warranted.

## 2018-03-08 ENCOUNTER — Telehealth: Payer: Self-pay | Admitting: Obstetrics and Gynecology

## 2018-03-08 NOTE — Telephone Encounter (Signed)
The patient called and stated that she is experiencing some lower back pain and abdominal pain and is not sure if it is associated with her IUD. The patient would like to speak with a nurse prior to scheduling. Please advise.

## 2018-03-11 ENCOUNTER — Telehealth: Payer: Self-pay | Admitting: Obstetrics and Gynecology

## 2018-03-11 NOTE — Telephone Encounter (Signed)
Pt was called was unable to LM due to mailbox being full.

## 2018-03-11 NOTE — Telephone Encounter (Signed)
Pt called no answer LM via voicemail to call the office.  

## 2018-03-11 NOTE — Telephone Encounter (Signed)
Patient returned the nurse's call.  Please call patient back.  Voicemail full, no other number per patient.  Please advise, thanks.

## 2018-03-13 NOTE — Telephone Encounter (Signed)
Please see another phone encounter.  

## 2018-03-13 NOTE — Telephone Encounter (Signed)
Pt called no answer LM via voicemail to call the office.  

## 2018-03-18 ENCOUNTER — Encounter: Payer: Medicaid Other | Admitting: Nurse Practitioner

## 2018-03-19 NOTE — Telephone Encounter (Signed)
Pt was called and she stated that she has some back pain that the pain sometimes moves to the front. Pt stated that it is not a lot of pain but pt was just concerned that it was her IUD. Pt was informed that if her pain increase she could come in before her appointment with [facility] on the April 03, 2018. Pt stated that she would just wait and visit then.

## 2018-04-03 ENCOUNTER — Encounter: Payer: Medicaid Other | Admitting: Obstetrics and Gynecology

## 2018-04-10 ENCOUNTER — Other Ambulatory Visit: Payer: Self-pay

## 2018-04-10 ENCOUNTER — Encounter: Payer: Self-pay | Admitting: Nurse Practitioner

## 2018-04-10 ENCOUNTER — Ambulatory Visit (INDEPENDENT_AMBULATORY_CARE_PROVIDER_SITE_OTHER): Payer: BLUE CROSS/BLUE SHIELD | Admitting: Nurse Practitioner

## 2018-04-10 ENCOUNTER — Other Ambulatory Visit (HOSPITAL_COMMUNITY)
Admission: RE | Admit: 2018-04-10 | Discharge: 2018-04-10 | Disposition: A | Discharge status: Home / Self Care | Payer: BLUE CROSS/BLUE SHIELD | Source: Ambulatory Visit | Attending: Nurse Practitioner | Admitting: Nurse Practitioner

## 2018-04-10 VITALS — BP 101/47 | HR 62 | Temp 98.7°F | Ht 61.0 in | Wt 151.2 lb

## 2018-04-10 DIAGNOSIS — Z118 Encounter for screening for other infectious and parasitic diseases: Secondary | ICD-10-CM | POA: Diagnosis not present

## 2018-04-10 DIAGNOSIS — M545 Low back pain, unspecified: Secondary | ICD-10-CM

## 2018-04-10 DIAGNOSIS — Z0000 Encounter for general adult medical examination without abnormal findings: Secondary | ICD-10-CM

## 2018-04-10 NOTE — Progress Notes (Signed)
Subjective:    Patient ID: Sherry Malone, female    DOB: 1995/09/15, 22 y.o.   MRN: [id number]  Sherry Malone is a 22 y.o. female presenting on 04/10/2018 for Annual Exam (constant back pain x 1 mth)   HPI Annual Physical Exam Patient has been feeling well.  She has acute concerns today for back pain. Sleeps 5 hours per night interrupted with feeding (breastfeeding 699 month old).  Also has some daytime naps.  HEALTH MAINTENANCE: Weight/BMI: Continues to have decreased weight after pregnancy.  Physical activity: work and parenting keep her active. Diet: Generally regular - occasionally skipped meals, tries to snack when skipping a meal Seatbelt: always Sunscreen: no regular sunscreen PAP: done, due for GYN exam tomorrow HIV: neg in past Optometry: regular Dentistry: regular  VACCINES: Tetanus: 2018  Low Back Pain Patient reports low back pain with onset gradually over last 2-3 months.  In last 3 weeks has increasing difficulty with mobility after work.  Often "feels a catch."  She occasionally has shooting pain down legs.  Is taking occasional acetaminophen and ibuprofen for symptoms with minimal relief.  Patient continues to breastfeed her 6278-month old child.     Past Medical History:  Diagnosis Date  . Asthma   . History of trichomoniasis 01/16/2017  . Medical history non-contributory    Past Surgical History:  Procedure Laterality Date  . CHOLECYSTECTOMY N/A 05/15/2016   Procedure: LAPAROSCOPIC CHOLECYSTECTOMY;  Surgeon: Leafy Roiego F Pabon, MD;  Location: [facility] ORS;  Service: General;  Laterality: N/A;  . NO PAST SURGERIES     Social History   Socioeconomic History  . Marital status: Single    Spouse name: Not on file  . Number of children: Not on file  . Years of education: Not on file  . Highest education level: Not on file  Occupational History  . Not on file  Social Needs  . Financial resource strain: Not on file  . Food insecurity:    Worry:  Not on file    Inability: Not on file  . Transportation needs:    Medical: Not on file    Non-medical: Not on file  Tobacco Use  . Smoking status: Former Smoker    Packs/day: 0.25    Types: Cigarettes    Last attempt to quit: 04/30/2015    Years since quitting: 2.9  . Smokeless tobacco: Never Used  Substance and Sexual Activity  . Alcohol use: Yes    Comment: rarely  . Drug use: No    Frequency: 9.0 times per week  . Sexual activity: Yes    Birth control/protection: IUD  Lifestyle  . Physical activity:    Days per week: Not on file    Minutes per session: Not on file  . Stress: Not on file  Relationships  . Social connections:    Talks on phone: Not on file    Gets together: Not on file    Attends religious service: Not on file    Active member of club or organization: Not on file    Attends meetings of clubs or organizations: Not on file    Relationship status: Not on file  . Intimate partner violence:    Fear of current or ex partner: Not on file    Emotionally abused: Not on file    Physically abused: Not on file    Forced sexual activity: Not on file  Other Topics Concern  . Not on file  Social History Narrative  .  Not on file   Family History  Problem Relation Age of Onset  . Hypertension Father   . Diabetes Maternal Grandmother   . Kidney disease Maternal Grandmother   . Gallstones Other    Current Outpatient Medications on File Prior to Visit  Medication Sig  . albuterol (PROVENTIL HFA;VENTOLIN HFA) 108 (90 Base) MCG/ACT inhaler Inhale 1-2 puffs into the lungs every 6 (six) hours as needed.   . fluticasone (FLONASE) 50 MCG/ACT nasal spray Place 2 sprays into both nostrils daily.  Marland Kitchen levonorgestrel (MIRENA) 20 MCG/24HR IUD 1 each by Intrauterine route once.  . Prenatal Vit-Fe Fumarate-FA (PRENATAL MULTIVITAMIN) TABS tablet Take 1 tablet by mouth daily at 12 noon.   No current facility-administered medications on file prior to visit.     Review of Systems    Constitutional: Negative for chills and fever.  HENT: Negative for congestion and sore throat.   Eyes: Negative for pain.  Respiratory: Negative for cough, shortness of breath and wheezing.   Cardiovascular: Negative for chest pain, palpitations and leg swelling.  Gastrointestinal: Negative for abdominal pain, blood in stool, constipation, diarrhea, nausea and vomiting.  Endocrine: Negative for polydipsia.  Genitourinary: Negative for dysuria, frequency, hematuria and urgency.  Musculoskeletal: Positive for back pain. Negative for myalgias and neck pain.  Skin: Negative.  Negative for rash.  Allergic/Immunologic: Negative for environmental allergies.  Neurological: Negative for dizziness, weakness and headaches.  Hematological: Does not bruise/bleed easily.  Psychiatric/Behavioral: Negative for dysphoric mood and suicidal ideas. The patient is not nervous/anxious.    Per HPI unless specifically indicated above     Objective:    BP (!) 101/47 (BP Location: Right Arm, Patient Position: Sitting, Cuff Size: Normal)   Pulse 62   Temp 98.7 F (37.1 C) (Oral)   Ht 5\' 1"  (1.549 m)   Wt 151 lb 3.2 oz (68.6 kg)   BMI 28.57 kg/m   Wt Readings from Last 3 Encounters:  04/10/18 151 lb 3.2 oz (68.6 kg)  12/17/17 155 lb 12.8 oz (70.7 kg)  10/03/17 159 lb 4.8 oz (72.3 kg)    Physical Exam  Constitutional: She is oriented to person, place, and time. She appears well-developed and well-nourished. No distress.  HENT:  Head: Normocephalic and atraumatic.  Right Ear: External ear normal.  Left Ear: External ear normal.  Nose: Nose normal.  Mouth/Throat: Oropharynx is clear and moist.  Eyes: Pupils are equal, round, and reactive to light. Conjunctivae are normal.  Neck: Normal range of motion. Neck supple. No JVD present. No tracheal deviation present. No thyromegaly present.  Cardiovascular: Normal rate, regular rhythm, normal heart sounds and intact distal pulses. Exam reveals no gallop and  no friction rub.  No murmur heard. Pulmonary/Chest: Effort normal and breath sounds normal. No respiratory distress.  Abdominal: Soft. Bowel sounds are normal. She exhibits no distension. There is no tenderness.  Musculoskeletal: Normal range of motion.  Low Back Inspection: Normal appearance, normal body habitus, no spinal deformity, symmetrical. Palpation: No tenderness over spinous processes. Bilateral lumbar paraspinal muscles mildy tender and with hypertonicity/spasm. ROM: Full active ROM forward flex / back extension, rotation L/R without discomfort Special Testing: Seated SLR without reproduced localized R/L back pain and withoutr radicular pain.  Strength: Bilateral hip flex/ext 5/5, knee flex/ext 5/5, ankle dorsiflex/plantarflex 5/5 Neurovascular: intact distal sensation to light touch  Lymphadenopathy:    She has no cervical adenopathy.  Neurological: She is alert and oriented to person, place, and time. No cranial nerve deficit.  Skin: Skin  is warm and dry.  Psychiatric: She has a normal mood and affect. Her behavior is normal. Judgment and thought content normal.  Nursing note and vitals reviewed.      Assessment & Plan:   Problem List Items Addressed This Visit    None    Visit Diagnoses    Encounter for annual physical exam    -  Primary   Relevant Orders   Urine cytology ancillary only   Lipid panel   TSH   COMPLETE METABOLIC PANEL WITH GFR   CBC with Differential/Platelet   Screening for chlamydial disease       Relevant Orders   Urine cytology ancillary only   Acute midline low back pain without sciatica          # Annual Physical: Physical exam with new findings of back pain.  Well adult with no other acute concerns.  Plan: 1. Obtain health maintenance screenings as above according to age. - Increase physical activity to 30 minutes most days of the week.  - Eat healthy diet high in vegetables and fruits; low in refined carbohydrates. - Chlamydia urine  screening for female in reproductive age per guidelines on OCP. 2. Return 1 year for annual physical.  # Low back pain likely self-limited.  Muscle strain possible complicated by overuse, repetition.  Plan:  1. Treat with OTC pain meds (acetaminophen and ibuprofen).  Discussed alternate dosing and max dosing. Strictly limit NSAIDs during breastfeeding as may reduce supply and does transfer to infant. 2. Apply heat and/or ice to affected area. 3. May also apply a muscle rub with lidocaine or lidocaine patch after heat or ice. 4. Defer muscle relaxers until after lactation has been completed. 5. Provided exercises for pain, encourage self-massage with soft ball. Consider PT. 6. Follow up prn.    Follow up plan: Return in about 1 year (around 04/11/2019) for annual physical.  [name] Theordore Cisnero, DNP, AGPCNP-BC Adult Gerontology Primary Care Nurse Practitioner [facility] 04/10/2018, 10:56 AM

## 2018-04-10 NOTE — Patient Instructions (Addendum)
Sherry Malone,   Thank you for coming in to clinic today.  1. - Increase physical activity to 30 minutes most days of the week.  - Eat healthy diet high in vegetables and fruits; low in refined carbohydrates.   2. You have a lumbar/low back muscle strain.  - Start taking Tylenol extra strength 1 to 2 tablets every 6-8 hours for aches or fever/chills for next few days as needed.  Do not take more than 3,000 mg in 24 hours from all medicines.   - Use heat and ice.  Apply this for 15 minutes at a time 6-8 times per day.   - Self massage with a small ball may help release muscle tension. - Muscle rub with lidocaine, lidocaine patch, Biofreeze, or tiger balm for topical pain relief.  Avoid using this with heat and ice to avoid burns.  Please schedule a follow-up appointment with [name] Terral Cooks, AGNP. Return in about 1 year (around 04/11/2019) for annual physical.  If you have any other questions or concerns, please feel free to call the clinic or send a message through MyChart. You may also schedule an earlier appointment if necessary.  You will receive a survey after today's visit either digitally by e-mail or paper by [employer] mail. Your experiences and feedback matter to us.  Please respond so we know how we are doing as we provide care for you.   [name] Elenore Wanninger, DNP, AGNP-BC Adult Gerontology Nurse Practitioner [facility]  Low Back Pain Exercises See other page with pictures of each exercise.  Start with 1 or 2 of these exercises that you are most comfortable with. Do not do any exercises that cause you significant worsening pain. Some of these may cause some "stretching soreness" but it should go away after you stop the exercise, and get better over time. Gradually increase up to 3-4 exercises as tolerated.  Standing hamstring stretch: Place the heel of your leg on a stool about 15 inches high. Keep your knee straight. Lean forward, bending at the hips until  you feel a mild stretch in the back of your thigh. Make sure you do not roll your shoulders and bend at the waist when doing this or you will stretch your lower back instead. Hold the stretch for 15 to 30 seconds. Repeat 3 times. Repeat the same stretch on your other leg.  Cat and camel: Get down on your hands and knees. Let your stomach sag, allowing your back to curve downward. Hold this position for 5 seconds. Then arch your back and hold for 5 seconds. Do 3 sets of 10.  Quadriped Arm/Leg Raises: Get down on your hands and knees. Tighten your abdominal muscles to stiffen your spine. While keeping your abdominals tight, raise one arm and the opposite leg away from you. Hold this position for 5 seconds. Lower your arm and leg slowly and alternate sides. Do this 10 times on each side.  Pelvic tilt: Lie on your back with your knees bent and your feet flat on the floor. Tighten your abdominal muscles and push your lower back into the floor. Hold this position for 5 seconds, then relax. Do 3 sets of 10.  Partial curl: Lie on your back with your knees bent and your feet flat on the floor. Tighten your stomach muscles and flatten your back against the floor. Tuck your chin to your chest. With your hands stretched out in front of you, curl your upper body forward until your  shoulders clear the floor. Hold this position for 3 seconds. Don't hold your breath. It helps to breathe out as you lift your shoulders up. Relax. Repeat 10 times. Build to 3 sets of 10. To challenge yourself, clasp your hands behind your head and keep your elbows out to the side.  Lower trunk rotation: Lie on your back with your knees bent and your feet flat on the floor. Tighten your abdominal muscles and push your lower back into the floor. Keeping your shoulders down flat, gently rotate your legs to one side, then the other as far as you can. Repeat 10 to 20 times.  Single knee to chest stretch: Lie on your back with your legs straight  out in front of you. Bring one knee up to your chest and grasp the back of your thigh. Pull your knee toward your chest, stretching your buttock muscle. Hold this position for 15 to 30 seconds and return to the starting position. Repeat 3 times on each side.  Double knee to chest: Lie on your back with your knees bent and your feet flat on the floor. Tighten your abdominal muscles and push your lower back into the floor. Pull both knees up to your chest. Hold for 5 seconds and repeat 10 to 20 times.

## 2018-04-11 ENCOUNTER — Ambulatory Visit (INDEPENDENT_AMBULATORY_CARE_PROVIDER_SITE_OTHER): Payer: BLUE CROSS/BLUE SHIELD | Admitting: Obstetrics and Gynecology

## 2018-04-11 ENCOUNTER — Encounter: Payer: Self-pay | Admitting: Obstetrics and Gynecology

## 2018-04-11 VITALS — BP 91/52 | HR 95 | Ht 61.0 in | Wt 150.0 lb

## 2018-04-11 DIAGNOSIS — Z862 Personal history of diseases of the blood and blood-forming organs and certain disorders involving the immune mechanism: Secondary | ICD-10-CM

## 2018-04-11 DIAGNOSIS — Z975 Presence of (intrauterine) contraceptive device: Secondary | ICD-10-CM

## 2018-04-11 DIAGNOSIS — N898 Other specified noninflammatory disorders of vagina: Secondary | ICD-10-CM

## 2018-04-11 DIAGNOSIS — R102 Pelvic and perineal pain: Secondary | ICD-10-CM | POA: Diagnosis not present

## 2018-04-11 DIAGNOSIS — E663 Overweight: Secondary | ICD-10-CM | POA: Diagnosis not present

## 2018-04-11 DIAGNOSIS — Z01419 Encounter for gynecological examination (general) (routine) without abnormal findings: Secondary | ICD-10-CM

## 2018-04-11 LAB — CBC WITH DIFFERENTIAL/PLATELET
Basophils Absolute: 48 cells/uL (ref 0–200)
Basophils Relative: 0.7 %
Eosinophils Absolute: 200 cells/uL (ref 15–500)
Eosinophils Relative: 2.9 %
HCT: 40.8 % (ref 35.0–45.0)
Hemoglobin: 13.6 g/dL (ref 11.7–15.5)
Lymphs Abs: 2098 cells/uL (ref 850–3900)
MCH: 27.9 pg (ref 27.0–33.0)
MCHC: 33.3 g/dL (ref 32.0–36.0)
MCV: 83.6 fL (ref 80.0–100.0)
MPV: 12.9 fL — ABNORMAL HIGH (ref 7.5–12.5)
Monocytes Relative: 6.1 %
Neutro Abs: 4133 cells/uL (ref 1500–7800)
Neutrophils Relative %: 59.9 %
Platelets: 239 10*3/uL (ref 140–400)
RBC: 4.88 10*6/uL (ref 3.80–5.10)
RDW: 12.7 % (ref 11.0–15.0)
Total Lymphocyte: 30.4 %
WBC mixed population: 421 cells/uL (ref 200–950)
WBC: 6.9 10*3/uL (ref 3.8–10.8)

## 2018-04-11 LAB — COMPLETE METABOLIC PANEL WITH GFR
AG Ratio: 1.7 (calc) (ref 1.0–2.5)
ALT: 17 U/L (ref 6–29)
AST: 19 U/L (ref 10–30)
Albumin: 4.4 g/dL (ref 3.6–5.1)
Alkaline phosphatase (APISO): 63 U/L (ref 33–115)
BUN: 12 mg/dL (ref 7–25)
CO2: 25 mmol/L (ref 20–32)
Calcium: 9.1 mg/dL (ref 8.6–10.2)
Chloride: 106 mmol/L (ref 98–110)
Creat: 0.74 mg/dL (ref 0.50–1.10)
GFR, Est African American: 133 mL/min/{1.73_m2} (ref >=60)
GFR, Est Non African American: 115 mL/min/{1.73_m2} (ref >=60)
Globulin: 2.6 g/dL (calc) (ref 1.9–3.7)
Glucose, Bld: 81 mg/dL (ref 65–99)
Potassium: 4 mmol/L (ref 3.5–5.3)
Sodium: 141 mmol/L (ref 135–146)
Total Bilirubin: 0.8 mg/dL (ref 0.2–1.2)
Total Protein: 7 g/dL (ref 6.1–8.1)

## 2018-04-11 LAB — LIPID PANEL
Cholesterol: 136 mg/dL (ref <200)
HDL: 51 mg/dL (ref >50)
LDL Cholesterol (Calc): 73 mg/dL (calc)
Non-HDL Cholesterol (Calc): 85 mg/dL (calc) (ref <130)
Total CHOL/HDL Ratio: 2.7 (calc) (ref <5.0)
Triglycerides: 42 mg/dL (ref <150)

## 2018-04-11 LAB — TSH: TSH: 1.09 mIU/L

## 2018-04-11 LAB — URINE CYTOLOGY ANCILLARY ONLY: Chlamydia: NEGATIVE

## 2018-04-11 MED ORDER — IBUPROFEN 800 MG PO TABS: 800.0000 mg | ORAL_TABLET | Freq: Three times a day (TID) | ORAL | 1 refills | Status: DC | PRN

## 2018-04-11 NOTE — Progress Notes (Signed)
Pt is present today for her annual exam. pt stated that she is having some discharge, no burning or foul odor.  Pt stated that she is doing well.,

## 2018-04-11 NOTE — Progress Notes (Signed)
GYNECOLOGY ANNUAL PHYSICAL EXAM PROGRESS NOTE  Subjective:    Sherry Malone is a 22 y.o. [id number] female who presents for an annual exam. The patient has no major complaints today. The patient is sexually active. The patient wears seatbelts: yes. The patient participates in regular exercise: no. Has the patient ever been transfused or tattooed?: yes- professional tattoos. The patient reports that there is not domestic violence in her life.    Gynecologic History Menarche age: 20. H/o irregular menses No LMP recorded. (Menstrual status: IUD). Contraception: IUD History of STI's: H/o trichomoniasis Last Pap: 12/22/2016. Results were: normal.  Denies h/o abnormal pap smears.   OB History  Gravida Para Term Preterm AB Living  2 2 2  0 0 2  SAB TAB Ectopic Multiple Live Births  0 0 0 0 2    # Outcome Date GA Lbr Len/2nd Weight Sex Delivery Anes PTL Lv  2 Term 07/04/17 [redacted]w[redacted]d / 00:24 7 lb 0.9 oz (3.2 kg) F Vag-Spont None  LIV     Name: Malone,GIRL Azalia     Apgar1: 8  Apgar5: 9  1 Term 10/26/15 [redacted]w[redacted]d 20:15 / 00:26 6 lb 4.2 oz (2.84 kg) F Vag-Forceps EPI  LIV     Name: ALONSO CONTRERAS,GIRL Tyshawn     Apgar1: 10  Apgar5: 9    Past Medical History:  Diagnosis Date  . Asthma   . History of trichomoniasis 01/16/2017    Past Surgical History:  Procedure Laterality Date  . CHOLECYSTECTOMY N/A 05/15/2016   Procedure: LAPAROSCOPIC CHOLECYSTECTOMY;  Surgeon: Leafy Ro, MD;  Location: [facility] ORS;  Service: General;  Laterality: N/A;  . NO PAST SURGERIES      Family History  Problem Relation Age of Onset  . Hypertension Father   . Diabetes Maternal Grandmother   . Kidney disease Maternal Grandmother   . Gallstones Other     Social History   Socioeconomic History  . Marital status: Single    Spouse name: Not on file  . Number of children: Not on file  . Years of education: Not on file  . Highest education level: Not on file  Occupational History  .  Not on file  Social Needs  . Financial resource strain: Not on file  . Food insecurity:    Worry: Not on file    Inability: Not on file  . Transportation needs:    Medical: Not on file    Non-medical: Not on file  Tobacco Use  . Smoking status: Former Smoker    Packs/day: 0.25    Types: Cigarettes    Last attempt to quit: 04/30/2015    Years since quitting: 2.9  . Smokeless tobacco: Never Used  Substance and Sexual Activity  . Alcohol use: Yes    Comment: rarely  . Drug use: No    Frequency: 9.0 times per week  . Sexual activity: Yes    Birth control/protection: IUD  Lifestyle  . Physical activity:    Days per week: Not on file    Minutes per session: Not on file  . Stress: Not on file  Relationships  . Social connections:    Talks on phone: Not on file    Gets together: Not on file    Attends religious service: Not on file    Active member of club or organization: Not on file    Attends meetings of clubs or organizations: Not on file    Relationship status: Not on file  .  Intimate partner violence:    Fear of current or ex partner: Not on file    Emotionally abused: Not on file    Physically abused: Not on file    Forced sexual activity: Not on file  Other Topics Concern  . Not on file  Social History Narrative  . Not on file    Current Outpatient Medications on File Prior to Visit  Medication Sig Dispense Refill  . albuterol (PROVENTIL HFA;VENTOLIN HFA) 108 (90 Base) MCG/ACT inhaler Inhale 1-2 puffs into the lungs every 6 (six) hours as needed.     . fluticasone (FLONASE) 50 MCG/ACT nasal spray Place 2 sprays into both nostrils daily. 16 g 6  . levonorgestrel (MIRENA) 20 MCG/24HR IUD 1 each by Intrauterine route once.     No current facility-administered medications on file prior to visit.     No Known Allergies   Review of Systems Constitutional: negative for chills, fatigue, fevers and sweats Eyes: negative for irritation, redness and visual  disturbance Ears, nose, mouth, throat, and face: negative for hearing loss, nasal congestion, snoring and tinnitus Respiratory: negative for asthma, cough, sputum Cardiovascular: negative for chest pain, dyspnea, exertional chest pressure/discomfort, irregular heart beat, palpitations and syncope Gastrointestinal: negative for abdominal pain, change in bowel habits, nausea and vomiting Genitourinary: positive for vaginal discharge, but denies odor, itching, or burning.  Negative for abnormal menstrual periods, genital lesions, sexual problems and dysuria and urinary incontinence Integument/breast: negative for breast lump, breast tenderness and nipple discharge Hematologic/lymphatic: negative for bleeding and easy bruising Musculoskeletal:negative for muscle weakness.  Back pain that sometimes radiates to the front (partially releived by OTC Motrin).  Neurological: negative for dizziness, headaches, vertigo and weakness Endocrine: negative for diabetic symptoms including polydipsia, polyuria and skin dryness Allergic/Immunologic: negative for hay fever and urticaria        Objective:  Blood pressure (!) 91/52, pulse 95, height 5\' 1"  (1.549 m), weight 150 lb (68 kg), currently breastfeeding. Body mass index is 28.34 kg/m.  General Appearance:    Alert, cooperative, no distress, appears stated age, overweight  Head:    Normocephalic, without obvious abnormality, atraumatic  Eyes:    PERRL, conjunctiva/corneas clear, EOM's intact, both eyes  Ears:    Normal external ear canals, both ears  Nose:   Nares normal, septum midline, mucosa normal, no drainage or sinus tenderness  Throat:   Lips, mucosa, and tongue normal; teeth and gums normal  Neck:   Supple, symmetrical, trachea midline, no adenopathy; thyroid: no enlargement/tenderness/nodules; no carotid bruit or JVD  Back:     Symmetric, no curvature, ROM normal, no CVA tenderness  Lungs:     Clear to auscultation bilaterally, respirations  unlabored  Chest Wall:    No tenderness or deformity   Heart:    Regular rate and rhythm, S1 and S2 normal, no murmur, rub or gallop  Breast Exam:    No tenderness, masses, or nipple abnormality  Abdomen:     Soft, non-tender, bowel sounds active all four quadrants, no masses, no organomegaly.    Genitalia:    Pelvic:external genitalia normal, vagina without lesions, or tenderness, rectovaginal septum  normal. Vagina with moderate amount of thin white discharge. Cervix normal in appearance, no cervical motion tenderness, IUD threads visible, ~ 3 cm. No adnexal masses or tenderness.  Uterus normal size, shape, mobile, regular contours, nontender.  Rectal:    Normal external sphincter.  No hemorrhoids appreciated. Internal exam not done.   Extremities:   Extremities normal, atraumatic,  no cyanosis or edema  Pulses:   2+ and symmetric all extremities  Skin:   Skin color, texture, turgor normal, no rashes or lesions  Lymph nodes:   Cervical, supraclavicular, and axillary nodes normal  Neurologic:   CNII-XII intact, normal strength, sensation and reflexes throughout   . Microscopic wet-mount exam shows negative for pathogens, normal epithelial cells.   Labs:  Lab Results  Component Value Date   WBC 9.9 07/04/2017   HGB 10.7 (L) 07/04/2017   HCT 31.6 (L) 07/04/2017   MCV 81.0 07/04/2017   PLT 139 (L) 07/04/2017    Lab Results  Component Value Date   CREATININE 0.33 (L) 01/23/2017   BUN 7 01/23/2017   NA 137 01/23/2017   K 4.1 01/23/2017   CL 109 01/23/2017   CO2 20 (L) 01/23/2017    Lab Results  Component Value Date   ALT 10 (L) 01/23/2017   AST 25 01/23/2017   ALKPHOS 41 01/23/2017   BILITOT 0.8 01/23/2017    No results found for: TSH   Assessment:   Healthy female exam.  Leukorrhea IUD in place History of anemia Overweight Lactating mother  Back/pelvic pain  Plan:     Blood tests: CBC with diff and Comprehensive metabolic panel. Breast self exam technique  reviewed and patient encouraged to perform self-exam monthly. Contraception: Mirena IUD. Discussed healthy lifestyle modifications. Pap smear up to date.   History of anemia (during pregnancy). Will recheck levels today.  Declines STD testing today. Discussed HPV vaccine. Included in AVS Given reassurance that vaginal discharge was physiologic.  Back pain and pelvic cramping. Notes Ibuprofen partially helps, but is only taking OTC dose. Will prescribe 800 mg.  Follow up in 1 year.     Hildred Laser, MD [facility]

## 2018-04-12 LAB — COMPREHENSIVE METABOLIC PANEL
ALT: 18 IU/L (ref 0–32)
AST: 19 IU/L (ref 0–40)
Albumin/Globulin Ratio: 1.8 (ref 1.2–2.2)
Albumin: 4.2 g/dL (ref 3.5–5.5)
Alkaline Phosphatase: 64 IU/L (ref 39–117)
BUN/Creatinine Ratio: 23 (ref 9–23)
BUN: 14 mg/dL (ref 6–20)
Bilirubin Total: 0.8 mg/dL (ref 0.0–1.2)
CO2: 19 mmol/L — ABNORMAL LOW (ref 20–29)
Calcium: 9.1 mg/dL (ref 8.7–10.2)
Chloride: 108 mmol/L — ABNORMAL HIGH (ref 96–106)
Creatinine, Ser: 0.61 mg/dL (ref 0.57–1.00)
GFR calc Af Amer: 149 mL/min/{1.73_m2} (ref >59)
GFR calc non Af Amer: 129 mL/min/{1.73_m2} (ref >59)
Globulin, Total: 2.3 g/dL (ref 1.5–4.5)
Glucose: 88 mg/dL (ref 65–99)
Potassium: 4.1 mmol/L (ref 3.5–5.2)
Sodium: 143 mmol/L (ref 134–144)
Total Protein: 6.5 g/dL (ref 6.0–8.5)

## 2018-04-13 NOTE — Patient Instructions (Signed)
HPV (Human Papillomavirus) Vaccine: What You Need to Know 1. Why get vaccinated? HPV vaccine prevents infection with human papillomavirus (HPV) types that are associated with many cancers, including:  cervical cancer in females,  vaginal and vulvar cancers in females,  anal cancer in females and males,  throat cancer in females and males, and  penile cancer in males.  In addition, HPV vaccine prevents infection with HPV types that cause genital warts in both females and males. In the [place] about 12,000 women get cervical cancer every year, and about 4,000 women die from it. HPV vaccine can prevent most of these cases of cervical cancer. Vaccination is not a substitute for cervical cancer screening. This vaccine does not protect against all HPV types that can cause cervical cancer. Women should still get regular Pap tests. HPV infection usually comes from sexual contact, and most people will become infected at some point in their life. About 14 million Americans, including teens, get infected every year. Most infections will go away on their own and not cause serious problems. But thousands of women and men get cancer and other diseases from HPV. 2. HPV vaccine HPV vaccine is approved by FDA and is recommended by CDC for both males and females. It is routinely given at 80 or 22 years of age, but it may be given beginning at age 17 years through age 9 years. Most adolescents 9 through 22 years of age should get HPV vaccine as a two-dose series with the doses separated by 6-12 months. People who start HPV vaccination at 10 years of age and older should get the vaccine as a three-dose series with the second dose given 1-2 months after the first dose and the third dose given 6 months after the first dose. There are several exceptions to these age recommendations. Your health care provider can give you more information. 3. Some people should not get this vaccine  Anyone who has had a severe  (life-threatening) allergic reaction to a dose of HPV vaccine should not get another dose.  Anyone who has a severe (life threatening) allergy to any component of HPV vaccine should not get the vaccine.  Tell your doctor if you have any severe allergies that you know of, including a severe allergy to yeast.  HPV vaccine is not recommended for pregnant women. If you learn that you were pregnant when you were vaccinated, there is no reason to expect any problems for you or your baby. Any woman who learns she was pregnant when she got HPV vaccine is encouraged to contact the manufacturer's registry for HPV vaccination during pregnancy at (424)122-5857. Women who are breastfeeding may be vaccinated.  If you have a mild illness, such as a cold, you can probably get the vaccine today. If you are moderately or severely ill, you should probably wait until you recover. Your doctor can advise you. 4. Risks of a vaccine reaction With any medicine, including vaccines, there is a chance of side effects. These are usually mild and go away on their own, but serious reactions are also possible. Most people who get HPV vaccine do not have any serious problems with it. Mild or moderate problems following HPV vaccine:  Reactions in the arm where the shot was given: ? Soreness (about 9 people in 10) ? Redness or swelling (about 1 person in 3)  Fever: ? Mild (100F) (about 1 person in 10) ? Moderate (102F) (about 1 person in 26)  Other problems: ? Headache (about 1 person  in 3) Problems that could happen after any injected vaccine:  People sometimes faint after a medical procedure, including vaccination. Sitting or lying down for about 15 minutes can help prevent fainting, and injuries caused by a fall. Tell your doctor if you feel dizzy, or have vision changes or ringing in the ears.  Some people get severe pain in the shoulder and have difficulty moving the arm where a shot was given. This happens very  rarely.  Any medication can cause a severe allergic reaction. Such reactions from a vaccine are very rare, estimated at about 1 in a million doses, and would happen within a few minutes to a few hours after the vaccination. As with any medicine, there is a very remote chance of a vaccine causing a serious injury or death. The safety of vaccines is always being monitored. For more information, visit: http://www.aguilar.org/. 5. What if there is a serious reaction? What should I look for? Look for anything that concerns you, such as signs of a severe allergic reaction, very high fever, or unusual behavior. Signs of a severe allergic reaction can include hives, swelling of the face and throat, difficulty breathing, a fast heartbeat, dizziness, and weakness. These would usually start a few minutes to a few hours after the vaccination. What should I do? If you think it is a severe allergic reaction or other emergency that can't wait, call 9-1-1 or get to the nearest hospital. Otherwise, call your doctor. Afterward, the reaction should be reported to the Vaccine Adverse Event Reporting System (VAERS). Your doctor should file this report, or you can do it yourself through the VAERS web site at www.vaers.SamedayNews.es, or by calling (941)470-8347. VAERS does not give medical advice. 6. The National Vaccine Injury Compensation Program The [employer] Vaccine Injury Compensation Program (VICP) is a federal program that was created to compensate people who may have been injured by certain vaccines. Persons who believe they may have been injured by a vaccine can learn about the program and about filing a claim by calling 450-449-6004 or visiting the [place] website at GoldCloset.com.ee. There is a time limit to file a claim for compensation. 7. How can I learn more?  Ask your health care provider. He or she can give you the vaccine package insert or suggest other sources of information.  Call your  local or state health department.  Contact the Centers for Disease Control and Prevention (CDC): ? Call 312-836-8963 (1-800-CDC-INFO) or ? Visit CDC's website at http://sweeney-todd.com/ Vaccine Information Statement, HPV Vaccine (07/23/2015) This information is not intended to replace advice given to you by your health care provider. Make sure you discuss any questions you have with your health care provider. Document Released: 03/04/2014 Document Revised: 04/27/2016 Document Reviewed: 04/27/2016 Elsevier Interactive Patient Education  2017 [place] Maintenance, Female Adopting a healthy lifestyle and getting preventive care can go a long way to promote health and wellness. Talk with your health care provider about what schedule of regular examinations is right for you. This is a good chance for you to check in with your provider about disease prevention and staying healthy. In between checkups, there are plenty of things you can do on your own. Experts have done a lot of research about which lifestyle changes and preventive measures are most likely to keep you healthy. Ask your health care provider for more information. Weight and diet Eat a healthy diet  Be sure to include plenty of vegetables, fruits, low-fat dairy products, and lean protein.  Do not eat a lot of foods high in solid fats, added sugars, or salt.  Get regular exercise. This is one of the most important things you can do for your health. ? Most adults should exercise for at least 150 minutes each week. The exercise should increase your heart rate and make you sweat (moderate-intensity exercise). ? Most adults should also do strengthening exercises at least twice a week. This is in addition to the moderate-intensity exercise.  Maintain a healthy weight  Body mass index (BMI) is a measurement that can be used to identify possible weight problems. It estimates body fat based on height and weight. Your health care  provider can help determine your BMI and help you achieve or maintain a healthy weight.  For females 72 years of age and older: ? A BMI below 18.5 is considered underweight. ? A BMI of 18.5 to 24.9 is normal. ? A BMI of 25 to 29.9 is considered overweight. ? A BMI of 30 and above is considered obese.  Watch levels of cholesterol and blood lipids  You should start having your blood tested for lipids and cholesterol at 22 years of age, then have this test every 5 years.  You may need to have your cholesterol levels checked more often if: ? Your lipid or cholesterol levels are high. ? You are older than 22 years of age. ? You are at high risk for heart disease.  Cancer screening Lung Cancer  Lung cancer screening is recommended for adults 50-6 years old who are at high risk for lung cancer because of a history of smoking.  A yearly low-dose CT scan of the lungs is recommended for people who: ? Currently smoke. ? Have quit within the past 15 years. ? Have at least a 30-pack-year history of smoking. A pack year is smoking an average of one pack of cigarettes a day for 1 year.  Yearly screening should continue until it has been 15 years since you quit.  Yearly screening should stop if you develop a health problem that would prevent you from having lung cancer treatment.  Breast Cancer  Practice breast self-awareness. This means understanding how your breasts normally appear and feel.  It also means doing regular breast self-exams. Let your health care provider know about any changes, no matter how small.  If you are in your 20s or 30s, you should have a clinical breast exam (CBE) by a health care provider every 1-3 years as part of a regular health exam.  If you are 52 or older, have a CBE every year. Also consider having a breast X-ray (mammogram) every year.  If you have a family history of breast cancer, talk to your health care provider about genetic screening.  If you are  at high risk for breast cancer, talk to your health care provider about having an MRI and a mammogram every year.  Breast cancer gene (BRCA) assessment is recommended for women who have family members with BRCA-related cancers. BRCA-related cancers include: ? Breast. ? Ovarian. ? Tubal. ? Peritoneal cancers.  Results of the assessment will determine the need for genetic counseling and BRCA1 and BRCA2 testing.  Cervical Cancer Your health care provider may recommend that you be screened regularly for cancer of the pelvic organs (ovaries, uterus, and vagina). This screening involves a pelvic examination, including checking for microscopic changes to the surface of your cervix (Pap test). You may be encouraged to have this screening done every 3 years, beginning at  age 19.  For women ages 78-65, health care providers may recommend pelvic exams and Pap testing every 3 years, or they may recommend the Pap and pelvic exam, combined with testing for human papilloma virus (HPV), every 5 years. Some types of HPV increase your risk of cervical cancer. Testing for HPV may also be done on women of any age with unclear Pap test results.  Other health care providers may not recommend any screening for nonpregnant women who are considered low risk for pelvic cancer and who do not have symptoms. Ask your health care provider if a screening pelvic exam is right for you.  If you have had past treatment for cervical cancer or a condition that could lead to cancer, you need Pap tests and screening for cancer for at least 20 years after your treatment. If Pap tests have been discontinued, your risk factors (such as having a new sexual partner) need to be reassessed to determine if screening should resume. Some women have medical problems that increase the chance of getting cervical cancer. In these cases, your health care provider may recommend more frequent screening and Pap tests.  Colorectal Cancer  This type of  cancer can be detected and often prevented.  Routine colorectal cancer screening usually begins at 22 years of age and continues through 22 years of age.  Your health care provider may recommend screening at an earlier age if you have risk factors for colon cancer.  Your health care provider may also recommend using home test kits to check for hidden blood in the stool.  A small camera at the end of a tube can be used to examine your colon directly (sigmoidoscopy or colonoscopy). This is done to check for the earliest forms of colorectal cancer.  Routine screening usually begins at age 41.  Direct examination of the colon should be repeated every 5-10 years through 22 years of age. However, you may need to be screened more often if early forms of precancerous polyps or small growths are found.  Skin Cancer  Check your skin from head to toe regularly.  Tell your health care provider about any new moles or changes in moles, especially if there is a change in a mole's shape or color.  Also tell your health care provider if you have a mole that is larger than the size of a pencil eraser.  Always use sunscreen. Apply sunscreen liberally and repeatedly throughout the day.  Protect yourself by wearing long sleeves, pants, a wide-brimmed hat, and sunglasses whenever you are outside.  Heart disease, diabetes, and high blood pressure  High blood pressure causes heart disease and increases the risk of stroke. High blood pressure is more likely to develop in: ? People who have blood pressure in the high end of the normal range (130-139/85-89 mm Hg). ? People who are overweight or obese. ? People who are African American.  If you are 33-32 years of age, have your blood pressure checked every 3-5 years. If you are 75 years of age or older, have your blood pressure checked every year. You should have your blood pressure measured twice-once when you are at a hospital or clinic, and once when you are  not at a hospital or clinic. Record the average of the two measurements. To check your blood pressure when you are not at a hospital or clinic, you can use: ? An automated blood pressure machine at a pharmacy. ? A home blood pressure monitor.  If you are  between 61 years and 3 years old, ask your health care provider if you should take aspirin to prevent strokes.  Have regular diabetes screenings. This involves taking a blood sample to check your fasting blood sugar level. ? If you are at a normal weight and have a low risk for diabetes, have this test once every three years after 22 years of age. ? If you are overweight and have a high risk for diabetes, consider being tested at a younger age or more often. Preventing infection Hepatitis B  If you have a higher risk for hepatitis B, you should be screened for this virus. You are considered at high risk for hepatitis B if: ? You were born in a country where hepatitis B is common. Ask your health care provider which countries are considered high risk. ? Your parents were born in a high-risk country, and you have not been immunized against hepatitis B (hepatitis B vaccine). ? You have HIV or AIDS. ? You use needles to inject street drugs. ? You live with someone who has hepatitis B. ? You have had sex with someone who has hepatitis B. ? You get hemodialysis treatment. ? You take certain medicines for conditions, including cancer, organ transplantation, and autoimmune conditions.  Hepatitis C  Blood testing is recommended for: ? Everyone born from [birth year] through 1965. ? Anyone with known risk factors for hepatitis C.  Sexually transmitted infections (STIs)  You should be screened for sexually transmitted infections (STIs) including gonorrhea and chlamydia if: ? You are sexually active and are younger than 22 years of age. ? You are older than 22 years of age and your health care provider tells you that you are at risk for this type of  infection. ? Your sexual activity has changed since you were last screened and you are at an increased risk for chlamydia or gonorrhea. Ask your health care provider if you are at risk.  If you do not have HIV, but are at risk, it may be recommended that you take a prescription medicine daily to prevent HIV infection. This is called pre-exposure prophylaxis (PrEP). You are considered at risk if: ? You are sexually active and do not regularly use condoms or know the HIV status of your partner(s). ? You take drugs by injection. ? You are sexually active with a partner who has HIV.  Talk with your health care provider about whether you are at high risk of being infected with HIV. If you choose to begin PrEP, you should first be tested for HIV. You should then be tested every 3 months for as long as you are taking PrEP. Pregnancy  If you are premenopausal and you may become pregnant, ask your health care provider about preconception counseling.  If you may become pregnant, take 400 to 800 micrograms (mcg) of folic acid every day.  If you want to prevent pregnancy, talk to your health care provider about birth control (contraception). Osteoporosis and menopause  Osteoporosis is a disease in which the bones lose minerals and strength with aging. This can result in serious bone fractures. Your risk for osteoporosis can be identified using a bone density scan.  If you are 68 years of age or older, or if you are at risk for osteoporosis and fractures, ask your health care provider if you should be screened.  Ask your health care provider whether you should take a calcium or vitamin D supplement to lower your risk for osteoporosis.  Menopause may have  certain physical symptoms and risks.  Hormone replacement therapy may reduce some of these symptoms and risks. Talk to your health care provider about whether hormone replacement therapy is right for you. Follow these instructions at home:  Schedule  regular health, dental, and eye exams.  Stay current with your immunizations.  Do not use any tobacco products including cigarettes, chewing tobacco, or electronic cigarettes.  If you are pregnant, do not drink alcohol.  If you are breastfeeding, limit how much and how often you drink alcohol.  Limit alcohol intake to no more than 1 drink per day for nonpregnant women. One drink equals 12 ounces of beer, 5 ounces of wine, or 1 ounces of hard liquor.  Do not use street drugs.  Do not share needles.  Ask your health care provider for help if you need support or information about quitting drugs.  Tell your health care provider if you often feel depressed.  Tell your health care provider if you have ever been abused or do not feel safe at home. This information is not intended to replace advice given to you by your health care provider. Make sure you discuss any questions you have with your health care provider. Document Released: 02/20/2011 Document Revised: 01/13/2016 Document Reviewed: 05/11/2015 Elsevier Interactive Patient Education  [employer].

## 2018-07-10 ENCOUNTER — Ambulatory Visit (INDEPENDENT_AMBULATORY_CARE_PROVIDER_SITE_OTHER): Payer: BLUE CROSS/BLUE SHIELD | Admitting: Nurse Practitioner

## 2018-07-10 ENCOUNTER — Encounter: Payer: Self-pay | Admitting: Nurse Practitioner

## 2018-07-10 ENCOUNTER — Other Ambulatory Visit: Payer: Self-pay

## 2018-07-10 VITALS — BP 101/55 | HR 91 | Temp 98.7°F | Resp 17 | Ht 61.0 in | Wt 150.0 lb

## 2018-07-10 DIAGNOSIS — J0140 Acute pansinusitis, unspecified: Secondary | ICD-10-CM | POA: Diagnosis not present

## 2018-07-10 MED ORDER — BENZONATATE 100 MG PO CAPS: 100.0000 mg | ORAL_CAPSULE | Freq: Two times a day (BID) | ORAL | 0 refills | Status: DC | PRN

## 2018-07-10 MED ORDER — AMOXICILLIN-POT CLAVULANATE 875-125 MG PO TABS: 1.0000 | ORAL_TABLET | Freq: Two times a day (BID) | ORAL | 0 refills | Status: AC

## 2018-07-10 NOTE — Patient Instructions (Addendum)
Sherry Malone,   Thank you for coming in to clinic today.  1. It sounds like you have an Bacterial sinus infection.  Recommend good hand washing. - START Augmentin 875-125 mg one tablet every 12 hours for 10 days.  Make sure to take all doses of your antibiotic. - While you are on an antibiotic, take a probiotic.  Antibiotics kill good and bad bacteria.  A probiotic helps to replace your good bacteria. Probiotic pills can be found over the counter.  One brand is Florastor, but you can use any brand you prefer.  You can also get good bacteria from foods.  These foods are yogurt, kefir, kombucha, and fresh, refrigerated and uncooked sauerkraut. - Drink plenty of fluids.  - Start anti-histamine loratadine or cetirizine 10mg  daily. - You may also use Flonase 2 sprays each nostril daily for up to 4-6 weeks - for cough: take tessalon perles 100 mg one tablet twice as needed.  Other over the counter medications you may try, if needed for symptoms are: - If congestion is worse, start OTC Mucinex (or may try Mucinex-DM for cough) up to 7-10 days then stop - You may try over the counter Nasal Saline spray (Simply Saline, Ocean Spray) as needed to reduce congestion. - Start taking Tylenol extra strength 1 to 2 tablets every 6-8 hours for aches or fever/chills for next few days as needed.  Do not take more than 3,000 mg in 24 hours from all medicines.  You may also take ibuprofen 200-400mg  every 8 hours as needed.   - Drink warm herbal tea with honey for sore throat.   If symptoms are significantly worse with persistent fevers/chills despite tylenol/ibpurofen, nausea, vomiting unable to tolerate food/fluids or medicine, body aches, or shortness of breath, sinus pain pressure or worsening productive cough, then follow-up for re-evaluation, may seek more immediate care at Urgent Care or the ED if you are more concerned that it is an emergency.  Please schedule a follow-up appointment with [name]  [name], AGNP. Return 5-7 days if symptoms worsen or fail to improve.  If you have any other questions or concerns, please feel free to call the clinic or send a message through MyChart. You may also schedule an earlier appointment if necessary.  You will receive a survey after today's visit either digitally by e-mail or paper by [employer] mail. Your experiences and feedback matter to us.  Please respond so we know how we are doing as we provide care for you.   [name] Michaila Kenney, DNP, AGNP-BC Adult Gerontology Nurse Practitioner [facility], [id number]

## 2018-07-11 ENCOUNTER — Encounter: Payer: Self-pay | Admitting: Nurse Practitioner

## 2018-07-11 NOTE — Progress Notes (Signed)
Subjective:    Patient ID: Sherry Malone, female    DOB: 12-23-1995, 22 y.o.   MRN: [id number]  Sherry Malone is a 22 y.o. female presenting on 07/10/2018 for Cough (runny nose, post nasal drainage and headaches x 2 weeks )   HPI URI Patient presents today with 2 week history of uri symptoms.  Symptoms include rhinorrhea, post nasal drainage, sinus pressure, headaches, cough.  Cough has worsened this weekend.   - Denies fever, chills, or sweats.  Has had some mild chills, nausea.  No vomiting, diarrhea.   - No other sick contacts.  Her children have had runny nose a couple days ago. - Symptoms improved after about 10 days, but have worsened again over last 2-3 days. Social History   Tobacco Use  . Smoking status: Former Smoker    Packs/day: 0.25    Types: Cigarettes    Last attempt to quit: 04/30/2015    Years since quitting: 3.2  . Smokeless tobacco: Never Used  Substance Use Topics  . Alcohol use: Yes    Comment: rarely  . Drug use: No    Frequency: 9.0 times per week    Review of Systems Per HPI unless specifically indicated above     Objective:    BP (!) 101/55 (BP Location: Right Arm, Patient Position: Sitting, Cuff Size: Normal)   Pulse 91   Temp 98.7 F (37.1 C) (Oral)   Resp 17   Ht 5' 1"  (1.549 m)   Wt 150 lb (68 kg)   SpO2 100%   BMI 28.34 kg/m   Wt Readings from Last 3 Encounters:  07/10/18 150 lb (68 kg)  04/11/18 150 lb (68 kg)  04/10/18 151 lb 3.2 oz (68.6 kg)    Physical Exam  Constitutional: She is oriented to person, place, and time. She appears well-developed and well-nourished. No distress.  HENT:  Head: Normocephalic and atraumatic.  Right Ear: Hearing, tympanic membrane, external ear and ear canal normal.  Left Ear: Hearing, tympanic membrane, external ear and ear canal normal.  Nose: Mucosal edema and rhinorrhea present. Right sinus exhibits maxillary sinus tenderness and frontal sinus tenderness. Left sinus  exhibits maxillary sinus tenderness and frontal sinus tenderness.  Mouth/Throat: Uvula is midline, oropharynx is clear and moist and mucous membranes are normal. Tonsils are 0 on the right. Tonsils are 0 on the left.  Cardiovascular: Normal rate, regular rhythm, S1 normal, S2 normal, normal heart sounds and intact distal pulses.  Pulmonary/Chest: Effort normal and breath sounds normal. No respiratory distress.  Neurological: She is alert and oriented to person, place, and time.  Skin: Skin is warm and dry. Capillary refill takes less than 2 seconds.  Psychiatric: She has a normal mood and affect. Her behavior is normal. Judgment and thought content normal.  Vitals reviewed.    Results for orders placed or performed in visit on 04/11/18  Comp Met (CMET)  Result Value Ref Range   Glucose 88 65 - 99 mg/dL   BUN 14 6 - 20 mg/dL   Creatinine, Ser 0.61 0.57 - 1.00 mg/dL   GFR calc non Af Amer 129 >59 mL/min/1.73   GFR calc Af Amer 149 >59 mL/min/1.73   BUN/Creatinine Ratio 23 9 - 23   Sodium 143 134 - 144 mmol/L   Potassium 4.1 3.5 - 5.2 mmol/L   Chloride 108 (H) 96 - 106 mmol/L   CO2 19 (L) 20 - 29 mmol/L   Calcium 9.1 8.7 - 10.2 mg/dL  Total Protein 6.5 6.0 - 8.5 g/dL   Albumin 4.2 3.5 - 5.5 g/dL   Globulin, Total 2.3 1.5 - 4.5 g/dL   Albumin/Globulin Ratio 1.8 1.2 - 2.2   Bilirubin Total 0.8 0.0 - 1.2 mg/dL   Alkaline Phosphatase 64 39 - 117 IU/L   AST 19 0 - 40 IU/L   ALT 18 0 - 32 IU/L      Assessment & Plan:   Problem List Items Addressed This Visit    None    Visit Diagnoses    Acute non-recurrent pansinusitis    -  Primary   Relevant Medications   benzonatate (TESSALON) 100 MG capsule   amoxicillin-clavulanate (AUGMENTIN) 875-125 MG tablet    Consistent with URI and secondary sinusitis with symptoms worsening over the past 7 days and initial symptoms of nasal congestion and sinus pressure over 2 weeks ago.   Plan: 1.START taking Augmentin 875-128m tablets every 12  hours for 10 days.  Discussed completing antibiotic. - While on antibiotic, take a probiotic OTC or from food. - Continue anti-histamine loratadine 137mdaily. - Can use Flonase 2 sprays each nostril daily for up to 4-6 weeks if no epistaxis. - Start Mucinex-DM OTC for  7-10 days prn congestion - Start benzonatate 100 mg capsules bid prn for cough 2. Supportive care with nasal saline, warm herbal tea with honey, 3. Improve hydration 4. Tylenol / Motrin PRN fevers  5. Return criteria given    Meds ordered this encounter  Medications  . benzonatate (TESSALON) 100 MG capsule    Sig: Take 1 capsule (100 mg total) by mouth 2 (two) times daily as needed for cough.    Dispense:  20 capsule    Refill:  0    Order Specific Question:   Supervising Provider    Answer:   KAOlin [id number]]  . amoxicillin-clavulanate (AUGMENTIN) 875-125 MG tablet    Sig: Take 1 tablet by mouth 2 (two) times daily for 10 days.    Dispense:  20 tablet    Refill:  0    Order Specific Question:   Supervising Provider    Answer:   KAOlin [id number]]    Follow up plan: Return 5-7 days if symptoms worsen or fail to improve.  LaCassell SmilesDNP, AGPCNP-BC Adult Gerontology Primary Care Nurse Practitioner [facility] 07/11/2018, 4:33 PM

## 2018-10-08 ENCOUNTER — Encounter: Payer: Self-pay | Admitting: Certified Nurse Midwife

## 2018-10-08 ENCOUNTER — Ambulatory Visit (INDEPENDENT_AMBULATORY_CARE_PROVIDER_SITE_OTHER): Payer: BLUE CROSS/BLUE SHIELD | Admitting: Certified Nurse Midwife

## 2018-10-08 VITALS — BP 111/51 | HR 89 | Ht 61.0 in | Wt 154.2 lb

## 2018-10-08 DIAGNOSIS — R102 Pelvic and perineal pain: Secondary | ICD-10-CM | POA: Diagnosis not present

## 2018-10-08 NOTE — Patient Instructions (Signed)
Pelvic Pain, Female  Pelvic pain is pain in your lower belly (abdomen), below your belly button and between your hips. The pain may start suddenly (be acute), keep coming back (be recurring), or last a long time (become chronic). Pelvic pain that lasts longer than 6 months is called chronic pelvic pain. There are many causes of pelvic pain. Sometimes the cause of pelvic pain is not known.  Follow these instructions at home:    · Take over-the-counter and prescription medicines only as told by your doctor.  · Rest as told by your doctor.  · Do not have sex if it hurts.  · Keep a journal of your pelvic pain. Write down:  ? When the pain started.  ? Where the pain is located.  ? What seems to make the pain better or worse, such as food or your period (menstrual cycle).  ? Any symptoms you have along with the pain.  · Keep all follow-up visits as told by your doctor. This is important.  Contact a doctor if:  · Medicine does not help your pain.  · Your pain comes back.  · You have new symptoms.  · You have unusual discharge or bleeding from your vagina.  · You have a fever or chills.  · You are having trouble pooping (constipation).  · You have blood in your pee (urine) or poop (stool).  · Your pee smells bad.  · You feel weak or light-headed.  Get help right away if:  · You have sudden pain that is very bad.  · Your pain keeps getting worse.  · You have very bad pain and also have any of these symptoms:  ? A fever.  ? Feeling sick to your stomach (nausea).  ? Throwing up (vomiting).  ? Being very sweaty.  · You pass out (lose consciousness).  Summary  · Pelvic pain is pain in your lower belly (abdomen), below your belly button and between your hips.  · There are many possible causes of pelvic pain.  · Keep a journal of your pelvic pain.  This information is not intended to replace advice given to you by your health care provider. Make sure you discuss any questions you have with your health care provider.  Document  Released: 01/24/2008 Document Revised: 01/23/2018 Document Reviewed: 01/23/2018  Elsevier Interactive Patient Education © 2019 [employer]

## 2018-10-08 NOTE — Progress Notes (Signed)
GYN ENCOUNTER NOTE  Subjective:       Sherry Malone is a 23 y.o. [phone] female is here for gynecologic evaluation of the following issues:  1. Pelvic pain that she started noticing last week. She states she first felt it with intercourse and it would subside. She is now feeling it more frequently and rates the pain 8/10 at it worst It is mid pelvis and to the left. She has not taken anything for the pain. She denies any new partners and potential for STD. She denies abnormal discharge.   Gynecologic History No LMP recorded. (Menstrual status: IUD). Contraception: IUD 09/05/17 Last Pap: 12/22/16. Results were: normal Last mammogram: N/A.   Obstetric History OB History  Gravida Para Term Preterm AB Living  2 2 2  0 0 2  SAB TAB Ectopic Multiple Live Births  0 0 0 0 2    # Outcome Date GA Lbr Len/2nd Weight Sex Delivery Anes PTL Lv  2 Term 07/04/17 [redacted]w[redacted]d / 00:24 7 lb 0.9 oz (3.2 kg) F Vag-Spont None  LIV  1 Term 10/26/15 [redacted]w[redacted]d 20:15 / 00:26 6 lb 4.2 oz (2.84 kg) F Vag-Forceps EPI  LIV    Past Medical History:  Diagnosis Date  . Asthma   . History of trichomoniasis 01/16/2017  . Medical history non-contributory     Past Surgical History:  Procedure Laterality Date  . CHOLECYSTECTOMY N/A 05/15/2016   Procedure: LAPAROSCOPIC CHOLECYSTECTOMY;  Surgeon: Leafy Ro, MD;  Location: [facility] ORS;  Service: General;  Laterality: N/A;  . NO PAST SURGERIES      Current Outpatient Medications on File Prior to Visit  Medication Sig Dispense Refill  . albuterol (PROVENTIL HFA;VENTOLIN HFA) 108 (90 Base) MCG/ACT inhaler Inhale 1-2 puffs into the lungs every 6 (six) hours as needed.     . benzonatate (TESSALON) 100 MG capsule Take 1 capsule (100 mg total) by mouth 2 (two) times daily as needed for cough. 20 capsule 0  . fluticasone (FLONASE) 50 MCG/ACT nasal spray Place 2 sprays into both nostrils daily. 16 g 6  . ibuprofen (ADVIL,MOTRIN) 800 MG tablet Take 1 tablet (800 mg total) by  mouth every 8 (eight) hours as needed for moderate pain or cramping. Take with food 60 tablet 1  . levonorgestrel (MIRENA) 20 MCG/24HR IUD 1 each by Intrauterine route once.     No current facility-administered medications on file prior to visit.     No Known Allergies  Social History   Socioeconomic History  . Marital status: Single    Spouse name: Not on file  . Number of children: Not on file  . Years of education: Not on file  . Highest education level: Not on file  Occupational History  . Not on file  Social Needs  . Financial resource strain: Not on file  . Food insecurity:    Worry: Not on file    Inability: Not on file  . Transportation needs:    Medical: Not on file    Non-medical: Not on file  Tobacco Use  . Smoking status: Former Smoker    Packs/day: 0.25    Types: Cigarettes    Last attempt to quit: 04/30/2015    Years since quitting: 3.4  . Smokeless tobacco: Never Used  Substance and Sexual Activity  . Alcohol use: Yes    Comment: rarely  . Drug use: No    Frequency: 9.0 times per week  . Sexual activity: Yes    Birth control/protection: I.U.D.  Lifestyle  . Physical activity:    Days per week: Not on file    Minutes per session: Not on file  . Stress: Not on file  Relationships  . Social connections:    Talks on phone: Not on file    Gets together: Not on file    Attends religious service: Not on file    Active member of club or organization: Not on file    Attends meetings of clubs or organizations: Not on file    Relationship status: Not on file  . Intimate partner violence:    Fear of current or ex partner: Not on file    Emotionally abused: Not on file    Physically abused: Not on file    Forced sexual activity: Not on file  Other Topics Concern  . Not on file  Social History Narrative  . Not on file    Family History  Problem Relation Age of Onset  . Hypertension Father   . Diabetes Maternal Grandmother   . Kidney disease Maternal  Grandmother   . Gallstones Other     The following portions of the patient's history were reviewed and updated as appropriate: allergies, current medications, past family history, past medical history, past social history, past surgical history and problem list.  Review of Systems Review of Systems - Negative except as mentioned in hpi Review of Systems - General ROS: negative for - chills, fatigue, fever, hot flashes, malaise or night sweats Hematological and Lymphatic ROS: negative for - bleeding problems or swollen lymph nodes Gastrointestinal ROS: negative for - abdominal pain, blood in stools, change in bowel habits and nausea/vomiting Musculoskeletal ROS: negative for - joint pain, muscle pain or muscular weakness Genito-Urinary ROS: negative for - change in menstrual cycle, dysmenorrhea, dyspareunia, dysuria, genital discharge, genital ulcers, hematuria, incontinence, irregular/heavy menses, nocturia. Positive for pelvic pain  Objective:   There were no vitals taken for this visit. CONSTITUTIONAL: Well-developed, well-nourished female in no acute distress.  HENT:  Normocephalic, atraumatic.  NECK: Normal range of motion, supple, no masses.  Normal thyroid.  SKIN: Skin is warm and dry. No rash noted. Not diaphoretic. No erythema. No pallor. NEUROLGIC: Alert and oriented to person, place, and time. PSYCHIATRIC: Normal mood and affect. Normal behavior. Normal judgment and thought content. CARDIOVASCULAR:Not Examined RESPIRATORY: Not Examined BREASTS: Not Examined ABDOMEN: Soft, non distended; Non tender.  No Organomegaly. PELVIC:  External Genitalia: Normal  BUS: Normal  Vagina: Normal  Cervix: Normal, strings present, short 1-2 cm . Tenderness with exam MUSCULOSKELETAL: Normal range of motion. No tenderness.  No cyanosis, clubbing, or edema.   Assessment:   Pelvic pain    Plan:   Discussed sensitivity of cervix and the potential to avoid some sexual positions to decrease  pain. Discussed potential for ovarian cyst vs malposition of IUD. Encouraged use of motrin to help with discomfort. U/s ordered. She will follow up with u/s at earliest available appointment.   Doreene Burke, CNM

## 2018-10-15 ENCOUNTER — Ambulatory Visit (INDEPENDENT_AMBULATORY_CARE_PROVIDER_SITE_OTHER): Payer: BLUE CROSS/BLUE SHIELD

## 2018-10-15 ENCOUNTER — Encounter: Payer: Self-pay | Admitting: Obstetrics and Gynecology

## 2018-10-15 ENCOUNTER — Ambulatory Visit (INDEPENDENT_AMBULATORY_CARE_PROVIDER_SITE_OTHER): Payer: BLUE CROSS/BLUE SHIELD | Admitting: Obstetrics and Gynecology

## 2018-10-15 VITALS — BP 104/67 | HR 61 | Ht 62.0 in | Wt 150.3 lb

## 2018-10-15 DIAGNOSIS — Z30433 Encounter for removal and reinsertion of intrauterine contraceptive device: Secondary | ICD-10-CM

## 2018-10-15 DIAGNOSIS — T8332XA Displacement of intrauterine contraceptive device, initial encounter: Secondary | ICD-10-CM

## 2018-10-15 DIAGNOSIS — R102 Pelvic and perineal pain: Secondary | ICD-10-CM | POA: Diagnosis not present

## 2018-10-15 NOTE — Patient Instructions (Signed)

## 2018-10-15 NOTE — Progress Notes (Signed)
GYNECOLOGY PROGRESS NOTE  Subjective:    Patient ID: Sherry Malone, female    DOB: 06-Oct-1995, 23 y.o.   MRN: [id number]  HPI  Patient is a 23 y.o. G1P2002 female who presents for follow up after ultrasound for pelvic pain.  She has been experiencing pelvic pain for the past 2 weeks, initially just with intercourse, but now is becoming more frequent even without activity. Pain has been toward the middle and left side of the pelvis. Was seen in office last week due to complaint.   The following portions of the patient's history were reviewed and updated as appropriate: allergies, current medications, past family history, past medical history, past social history, past surgical history and problem list.  Review of Systems Pertinent items noted in HPI and remainder of comprehensive ROS otherwise negative.   Objective:   Blood pressure 104/67, pulse 61, height 5\' 2"  (1.575 m), weight 150 lb 4.8 oz (68.2 kg), currently breastfeeding. General appearance: alert and no distress Abdomen: soft, non-tender; bowel sounds normal; no masses,  no organomegaly Pelvic: see exam below Extremities: extremities normal, atraumatic, no cyanosis or edema Neurologic: Grossly normal     Imaging:   Patient Name: Sherry Malone DOB: 1996/08/21 MRN: [id number]  ULTRASOUND REPORT  Location: [facility]  Date of Service: 10/15/2018     Indications:Pelvic Pain/IUD checking Findings:  The uterus is anteverted and measures 6.8x3.5x5.2cm. Echo texture is homogenous without evidence of focal masses.  The Endometrium measures 9.4 mm.  Endometrial canal fluid is seen measuring 79mm in width In the 3D [place] to the endometrium , the IUD appears deviated to the Left side of endometrium and its upper  horizontal arms appear crossing the Lt fundal part of the endometrium to the myometrium ( IUD appears not in normal position )  Right Ovary measures 3.5x1.5x2.5 cm. It is normal  in appearance. Left Ovary measures 3.6x2.3x2.5 cm. It is normal in appearance. Survey of the adnexa demonstrates no adnexal masses. There is no free fluid in the cul de sac.  Impression: 1. Endometrial canal fluid is seen measuring 10mm in width 2. In the 3D [place] to the endometrium , the IUD appears deviated to the Left side of endometrium and its upper  horizontal arms appear crossing the Lt fundal part of the endometrium to the myometrium ( IUD appears to not be in normal position )    Recommendations: 1.Clinical correlation with the patient's History and Physical Exam.  Augustine Radar, RDMS  Assessment:   Malpositioned IUD  Plan:   Discussion had with patient regarding ultrasound findings. Recommended removal of IUD today.  Contraception options reviewed with patient including abstinence; fertility period awareness methods; over the counter/barrier methods; hormonal contraceptive medication including pill, patch, ring, injection,contraceptive implant; hormonal and nonhormonal IUDs; permanent sterilization options including vasectomy and the various tubal sterilization modalities. Risks and benefits reviewed.  Patient reports non-compliance in the past with pills and injection.  Would like to have new IUD placed.  IUD removed and new IUD placed today (see procedure below). To f/u in 4 weeks.     GYNECOLOGY OFFICE PROCEDURE NOTE  Sherry Malone is a 23 y.o. [id number] here for Mirena IUD removal and reinsertion.   Last pap smear was on 12/22/2016 and was normal.  IUD Removal and Reinsertion  Patient identified, informed consent performed, consent signed.   Discussed risks of irregular bleeding, cramping, infection, malpositioning or misplacement of the IUD outside the uterus which may require further procedures. Also  advised to use backup contraception for one week as the risk of pregnancy is higher during the transition period of removing an IUD and replacing it with  another one. Time out was performed. Speculum placed in the vagina. The strings of the IUD were grasped and pulled using ring forceps. The IUD was successfully removed in its entirety. The cervix was cleaned with Betadine x 2 and grasped anteriorly with a single tooth tenaculum.  The new Mirena IUD insertion apparatus was used to sound the uterus to 7 cm;  the IUD was then placed per manufacturer's recommendations. Strings trimmed to 3 cm. Tenaculum was removed, good hemostasis noted. Patient tolerated procedure well.   Patient was given post-procedure instructions.  She was reminded to have backup contraception for one week during this transition period between IUDs.  Patient was also asked to check IUD strings periodically and follow up in 4 weeks for IUD check.   Hildred Laser, MD [facility]

## 2018-10-15 NOTE — Progress Notes (Signed)
Pt is present today due to IUD issues. No other problems.

## 2018-11-12 ENCOUNTER — Encounter: Payer: BLUE CROSS/BLUE SHIELD | Admitting: Obstetrics and Gynecology

## 2019-02-18 ENCOUNTER — Other Ambulatory Visit: Payer: Self-pay | Admitting: Obstetrics and Gynecology

## 2019-02-18 ENCOUNTER — Other Ambulatory Visit: Payer: Self-pay

## 2019-02-18 ENCOUNTER — Ambulatory Visit (INDEPENDENT_AMBULATORY_CARE_PROVIDER_SITE_OTHER): Payer: 59

## 2019-02-18 DIAGNOSIS — Z30431 Encounter for routine checking of intrauterine contraceptive device: Secondary | ICD-10-CM

## 2019-02-18 DIAGNOSIS — Z975 Presence of (intrauterine) contraceptive device: Secondary | ICD-10-CM

## 2019-04-23 ENCOUNTER — Ambulatory Visit (INDEPENDENT_AMBULATORY_CARE_PROVIDER_SITE_OTHER): Payer: 59 | Admitting: Obstetrics and Gynecology

## 2019-04-23 ENCOUNTER — Encounter: Payer: Self-pay | Admitting: Obstetrics and Gynecology

## 2019-04-23 ENCOUNTER — Other Ambulatory Visit: Payer: Self-pay

## 2019-04-23 VITALS — BP 94/64 | HR 83 | Ht 62.0 in | Wt 149.6 lb

## 2019-04-23 DIAGNOSIS — Z01419 Encounter for gynecological examination (general) (routine) without abnormal findings: Secondary | ICD-10-CM

## 2019-04-23 DIAGNOSIS — Z30432 Encounter for removal of intrauterine contraceptive device: Secondary | ICD-10-CM | POA: Diagnosis not present

## 2019-04-23 DIAGNOSIS — Z30013 Encounter for initial prescription of injectable contraceptive: Secondary | ICD-10-CM | POA: Diagnosis not present

## 2019-04-23 DIAGNOSIS — E663 Overweight: Secondary | ICD-10-CM

## 2019-04-23 DIAGNOSIS — R102 Pelvic and perineal pain: Secondary | ICD-10-CM

## 2019-04-23 MED ORDER — MEDROXYPROGESTERONE ACETATE 150 MG/ML IM SUSP: 150.0000 mg | INTRAMUSCULAR | 3 refills | Status: DC

## 2019-04-23 MED ORDER — MEDROXYPROGESTERONE ACETATE 150 MG/ML IM SUSP
150.0000 mg | Freq: Once | INTRAMUSCULAR | Status: AC
  Administered 2019-04-23: 150 mg via INTRAMUSCULAR

## 2019-04-23 NOTE — Progress Notes (Signed)
Pt present today for annual exam. Pt stated that she is having vaginal pain and think it maybe coming from the IUD. Pt would like to have her IUD removed but is unsure of what birth control she wants to replace it. Depo injection given today.

## 2019-04-23 NOTE — Patient Instructions (Addendum)
Health Maintenance, Female Adopting a healthy lifestyle and getting preventive care are important in promoting health and wellness. Ask your health care provider about:  The right schedule for you to have regular tests and exams.  Things you can do on your own to prevent diseases and keep yourself healthy. What should I know about diet, weight, and exercise? Eat a healthy diet   Eat a diet that includes plenty of vegetables, fruits, low-fat dairy products, and lean protein.  Do not eat a lot of foods that are high in solid fats, added sugars, or sodium. Maintain a healthy weight Body mass index (BMI) is used to identify weight problems. It estimates body fat based on height and weight. Your health care provider can help determine your BMI and help you achieve or maintain a healthy weight. Get regular exercise Get regular exercise. This is one of the most important things you can do for your health. Most adults should:  Exercise for at least 150 minutes each week. The exercise should increase your heart rate and make you sweat (moderate-intensity exercise).  Do strengthening exercises at least twice a week. This is in addition to the moderate-intensity exercise.  Spend less time sitting. Even light physical activity can be beneficial. Watch cholesterol and blood lipids Have your blood tested for lipids and cholesterol at 23 years of age, then have this test every 5 years. Have your cholesterol levels checked more often if:  Your lipid or cholesterol levels are high.  You are older than 23 years of age.  You are at high risk for heart disease. What should I know about cancer screening? Depending on your health history and family history, you may need to have cancer screening at various ages. This may include screening for:  Breast cancer.  Cervical cancer.  Colorectal cancer.  Skin cancer.  Lung cancer. What should I know about heart disease, diabetes, and high blood  pressure? Blood pressure and heart disease  High blood pressure causes heart disease and increases the risk of stroke. This is more likely to develop in people who have high blood pressure readings, are of African descent, or are overweight.  Have your blood pressure checked: ? Every 3-5 years if you are 18-39 years of age. ? Every year if you are 40 years old or older. Diabetes Have regular diabetes screenings. This checks your fasting blood sugar level. Have the screening done:  Once every three years after age 40 if you are at a normal weight and have a low risk for diabetes.  More often and at a younger age if you are overweight or have a high risk for diabetes. What should I know about preventing infection? Hepatitis B If you have a higher risk for hepatitis B, you should be screened for this virus. Talk with your health care provider to find out if you are at risk for hepatitis B infection. Hepatitis C Testing is recommended for:  Everyone born from 1945 through 1965.  Anyone with known risk factors for hepatitis C. Sexually transmitted infections (STIs)  Get screened for STIs, including gonorrhea and chlamydia, if: ? You are sexually active and are younger than 24 years of age. ? You are older than 24 years of age and your health care provider tells you that you are at risk for this type of infection. ? Your sexual activity has changed since you were last screened, and you are at increased risk for chlamydia or gonorrhea. Ask your health care provider if   you are at risk.  Ask your health care provider about whether you are at high risk for HIV. Your health care provider may recommend a prescription medicine to help prevent HIV infection. If you choose to take medicine to prevent HIV, you should first get tested for HIV. You should then be tested every 3 months for as long as you are taking the medicine. Pregnancy  If you are about to stop having your period (premenopausal) and  you may become pregnant, seek counseling before you get pregnant.  Take 400 to 800 micrograms (mcg) of folic acid every day if you become pregnant.  Ask for birth control (contraception) if you want to prevent pregnancy. Osteoporosis and menopause Osteoporosis is a disease in which the bones lose minerals and strength with aging. This can result in bone fractures. If you are 22 years old or older, or if you are at risk for osteoporosis and fractures, ask your health care provider if you should:  Be screened for bone loss.  Take a calcium or vitamin D supplement to lower your risk of fractures.  Be given hormone replacement therapy (HRT) to treat symptoms of menopause. Follow these instructions at home: Lifestyle  Do not use any products that contain nicotine or tobacco, such as cigarettes, e-cigarettes, and chewing tobacco. If you need help quitting, ask your health care provider.  Do not use street drugs.  Do not share needles.  Ask your health care provider for help if you need support or information about quitting drugs. Alcohol use  Do not drink alcohol if: ? Your health care provider tells you not to drink. ? You are pregnant, may be pregnant, or are planning to become pregnant.  If you drink alcohol: ? Limit how much you use to 0-1 drink a day. ? Limit intake if you are breastfeeding.  Be aware of how much alcohol is in your drink. In the [place] one drink equals one 12 oz bottle of beer (355 mL), one 5 oz glass of wine (148 mL), or one 1 oz glass of hard liquor (44 mL). General instructions  Schedule regular health, dental, and eye exams.  Stay current with your vaccines.  Tell your health care provider if: ? You often feel depressed. ? You have ever been abused or do not feel safe at home. Summary  Adopting a healthy lifestyle and getting preventive care are important in promoting health and wellness.  Follow your health care provider's instructions about healthy  diet, exercising, and getting tested or screened for diseases.  Follow your health care provider's instructions on monitoring your cholesterol and blood pressure. This information is not intended to replace advice given to you by your health care provider. Make sure you discuss any questions you have with your health care provider. Document Released: 02/20/2011 Document Revised: 07/31/2018 Document Reviewed: 07/31/2018 Elsevier Patient Education  [place].    Medroxyprogesterone injection [Contraceptive] What is this medicine? MEDROXYPROGESTERONE (me DROX ee proe JES te rone) contraceptive injections prevent pregnancy. They provide effective birth control for 3 months. Depo-subQ Provera 104 is also used for treating pain related to endometriosis. This medicine may be used for other purposes; ask your health care provider or pharmacist if you have questions. COMMON BRAND NAME(S): Depo-Provera, Depo-subQ Provera 104 What should I tell my health care provider before I take this medicine? They need to know if you have any of these conditions:  frequently drink alcohol  asthma  blood vessel disease or a history of  a blood clot in the lungs or legs  bone disease such as osteoporosis  breast cancer  diabetes  eating disorder (anorexia nervosa or bulimia)  high blood pressure  HIV infection or AIDS  kidney disease  liver disease  mental depression  migraine  seizures (convulsions)  stroke  tobacco smoker  vaginal bleeding  an unusual or allergic reaction to medroxyprogesterone, other hormones, medicines, foods, dyes, or preservatives  pregnant or trying to get pregnant  breast-feeding How should I use this medicine? Depo-Provera Contraceptive injection is given into a muscle. Depo-subQ Provera 104 injection is given under the skin. These injections are given by a health care professional. You must not be pregnant before getting an injection. The injection is  usually given during the first 5 days after the start of a menstrual period or 6 weeks after delivery of a baby. Talk to your pediatrician regarding the use of this medicine in children. Special care may be needed. These injections have been used in female children who have started having menstrual periods. Overdosage: If you think you have taken too much of this medicine contact a poison control center or emergency room at once. NOTE: This medicine is only for you. Do not share this medicine with others. What if I miss a dose? Try not to miss a dose. You must get an injection once every 3 months to maintain birth control. If you cannot keep an appointment, call and reschedule it. If you wait longer than 13 weeks between Depo-Provera contraceptive injections or longer than 14 weeks between Depo-subQ Provera 104 injections, you could get pregnant. Use another method for birth control if you miss your appointment. You may also need a pregnancy test before receiving another injection. What may interact with this medicine? Do not take this medicine with any of the following medications:  bosentan This medicine may also interact with the following medications:  aminoglutethimide  antibiotics or medicines for infections, especially rifampin, rifabutin, rifapentine, and griseofulvin  aprepitant  barbiturate medicines such as phenobarbital or primidone  bexarotene  carbamazepine  medicines for seizures like ethotoin, felbamate, oxcarbazepine, phenytoin, topiramate  modafinil  St. John's wort This list may not describe all possible interactions. Give your health care provider a list of all the medicines, herbs, non-prescription drugs, or dietary supplements you use. Also tell them if you smoke, drink alcohol, or use illegal drugs. Some items may interact with your medicine. What should I watch for while using this medicine? This drug does not protect you against HIV infection (AIDS) or other  sexually transmitted diseases. Use of this product may cause you to lose calcium from your bones. Loss of calcium may cause weak bones (osteoporosis). Only use this product for more than 2 years if other forms of birth control are not right for you. The longer you use this product for birth control the more likely you will be at risk for weak bones. Ask your health care professional how you can keep strong bones. You may have a change in bleeding pattern or irregular periods. Many females stop having periods while taking this drug. If you have received your injections on time, your chance of being pregnant is very low. If you think you may be pregnant, see your health care professional as soon as possible. Tell your health care professional if you want to get pregnant within the next year. The effect of this medicine may last a long time after you get your last injection. What side effects may I  notice from receiving this medicine? Side effects that you should report to your doctor or health care professional as soon as possible:  allergic reactions like skin rash, itching or hives, swelling of the face, lips, or tongue  breast tenderness or discharge  breathing problems  changes in vision  depression  feeling faint or lightheaded, falls  fever  pain in the abdomen, chest, groin, or leg  problems with balance, talking, walking  unusually weak or tired  yellowing of the eyes or skin Side effects that usually do not require medical attention (report to your doctor or health care professional if they continue or are bothersome):  acne  fluid retention and swelling  headache  irregular periods, spotting, or absent periods  temporary pain, itching, or skin reaction at site where injected  weight gain This list may not describe all possible side effects. Call your doctor for medical advice about side effects. You may report side effects to FDA at 1-800-FDA-1088. Where should I  keep my medicine? This does not apply. The injection will be given to you by a health care professional. NOTE: This sheet is a summary. It may not cover all possible information. If you have questions about this medicine, talk to your doctor, pharmacist, or health care provider.  2020 Elsevier/[employer] Standard (2008-08-28 18:37:56)    Carpal Tunnel Syndrome  Carpal tunnel syndrome is a condition that causes pain in your hand and arm. The carpal tunnel is a narrow area that is on the palm side of your wrist. Repeated wrist motion or certain diseases may cause swelling in the tunnel. This swelling can pinch the main nerve in the wrist (median nerve). What are the causes? This condition may be caused by:  Repeated wrist motions.  Wrist injuries.  Arthritis.  A sac of fluid (cyst) or abnormal growth (tumor) in the carpal tunnel.  Fluid buildup during pregnancy. Sometimes the cause is not known. What increases the risk? The following factors may make you more likely to develop this condition:  Having a job in which you move your wrist in the same way many times. This includes jobs like being a Midwifebutcher or a Conservation officer, naturecashier.  Being a woman.  Having other health conditions, such as: ? Diabetes. ? Obesity. ? A thyroid gland that is not active enough (hypothyroidism). ? Kidney failure. What are the signs or symptoms? Symptoms of this condition include:  A tingling feeling in your fingers.  Tingling or a loss of feeling (numbness) in your hand.  Pain in your entire arm. This pain may get worse when you bend your wrist and elbow for a long time.  Pain in your wrist that goes up your arm to your shoulder.  Pain that goes down into your palm or fingers.  A weak feeling in your hands. You may find it hard to grab and hold items. You may feel worse at night. How is this diagnosed? This condition is diagnosed with a medical history and physical exam. You may also have tests, such as:   Electromyogram (EMG). This test checks the signals that the nerves send to the muscles.  Nerve conduction study. This test checks how well signals pass through your nerves.  Imaging tests, such as X-rays, ultrasound, and MRI. These tests check for what might be the cause of your condition. How is this treated? This condition may be treated with:  Lifestyle changes. You will be asked to stop or change the activity that caused your problem.  Doing exercise  and activities that make bones and muscles stronger (physical therapy).  Learning how to use your hand again (occupational therapy).  Medicines for pain and swelling (inflammation). You may have injections in your wrist.  A wrist splint.  Surgery. Follow these instructions at home: If you have a splint:  Wear the splint as told by your doctor. Remove it only as told by your doctor.  Loosen the splint if your fingers: ? Tingle. ? Lose feeling (become numb). ? Turn cold and blue.  Keep the splint clean.  If the splint is not waterproof: ? Do not let it get wet. ? Cover it with a watertight covering when you take a bath or a shower. Managing pain, stiffness, and swelling   If told, put ice on the painful area: ? If you have a removable splint, remove it as told by your doctor. ? Put ice in a plastic bag. ? Place a towel between your skin and the bag. ? Leave the ice on for 20 minutes, 2-3 times per day. General instructions  Take over-the-counter and prescription medicines only as told by your doctor.  Rest your wrist from any activity that may cause pain. If needed, talk with your boss at work about changes that can help your wrist heal.  Do any exercises as told by your doctor, physical therapist, or occupational therapist.  Keep all follow-up visits as told by your doctor. This is important. Contact a doctor if:  You have new symptoms.  Medicine does not help your pain.  Your symptoms get worse. Get help  right away if:  You have very bad numbness or tingling in your wrist or hand. Summary  Carpal tunnel syndrome is a condition that causes pain in your hand and arm.  It is often caused by repeated wrist motions.  Lifestyle changes and medicines are used to treat this problem. Surgery may help in very bad cases.  Follow your doctor's instructions about wearing a splint, resting your wrist, keeping follow-up visits, and calling for help. This information is not intended to replace advice given to you by your health care provider. Make sure you discuss any questions you have with your health care provider. Document Released: 07/27/2011 Document Revised: 12/14/2017 Document Reviewed: 12/14/2017 Elsevier Patient Education  2020 [employer]

## 2019-04-23 NOTE — Progress Notes (Signed)
GYNECOLOGY ANNUAL PHYSICAL EXAM PROGRESS NOTE  Subjective:    Sherry Malone is a 23 y.o. [id number] female who presents for an annual exam.  The patient is sexually active. The patient wears seatbelts: yes. The patient participates in regular exercise: no. Has the patient ever been transfused or tattooed?: yes- professional tattoos. The patient reports that there is not domestic violence in her life.    The patient has the following complaints today complaints today:  1. Patient notes that she desires her IUD to be removed.  Notes that she is noting pelvic/vaginal pain, and no longer desires to keep the IUD in place. Would like to resume Depo provera for contraception.   Gynecologic History Menarche age: [age]. H/o irregular menses No LMP recorded (lmp unknown). (Menstrual status: IUD). Contraception: IUD History of STI's: H/o trichomoniasis Last Pap: 12/22/2016. Results were: normal.  Denies h/o abnormal pap smears.   OB History  Gravida Para Term Preterm AB Living  2 2 2  0 0 2  SAB TAB Ectopic Multiple Live Births  0 0 0 0 2    # Outcome Date GA Lbr Len/2nd Weight Sex Delivery Anes PTL Lv  2 Term 07/04/17 7290w0d / 00:24 7 lb 0.9 oz (3.2 kg) F Vag-Spont None  LIV     Name: Malone,GIRL Zuley     Apgar1: 8  Apgar5: 9  1 Term 10/26/15 923w3d 20:15 / 00:26 6 lb 4.2 oz (2.84 kg) F Vag-Forceps EPI  LIV     Name: ALONSO CONTRERAS,GIRL Sharee     Apgar1: 10  Apgar5: 9    Past Medical History:  Diagnosis Date  . Asthma   . History of trichomoniasis 01/16/2017    Past Surgical History:  Procedure Laterality Date  . CHOLECYSTECTOMY N/A 05/15/2016   Procedure: LAPAROSCOPIC CHOLECYSTECTOMY;  Surgeon: Leafy Roiego F Pabon, MD;  Location: [facility] ORS;  Service: General;  Laterality: N/A;  . NO PAST SURGERIES      Family History  Problem Relation Age of Onset  . Vision loss Mother   . Hearing loss Mother   . Hypertension Father   . Diabetes Maternal Grandmother   . Kidney  disease Maternal Grandmother   . Kidney failure Maternal Grandmother   . Gallstones Other     Social History   Socioeconomic History  . Marital status: Single    Spouse name: Not on file  . Number of children: Not on file  . Years of education: Not on file  . Highest education level: Not on file  Occupational History  . Not on file  Social Needs  . Financial resource strain: Not on file  . Food insecurity    Worry: Not on file    Inability: Not on file  . Transportation needs    Medical: Not on file    Non-medical: Not on file  Tobacco Use  . Smoking status: Former Smoker    Packs/day: 0.25    Types: Cigarettes    Quit date: 04/30/2015    Years since quitting: 3.9  . Smokeless tobacco: Never Used  Substance and Sexual Activity  . Alcohol use: Yes    Comment: rarely  . Drug use: No    Frequency: 9.0 times per week  . Sexual activity: Yes    Birth control/protection: I.U.D.  Lifestyle  . Physical activity    Days per week: Not on file    Minutes per session: Not on file  . Stress: Not on file  Relationships  .  Social Herbalist on phone: Not on file    Gets together: Not on file    Attends religious service: Not on file    Active member of club or organization: Not on file    Attends meetings of clubs or organizations: Not on file    Relationship status: Not on file  . Intimate partner violence    Fear of current or ex partner: Not on file    Emotionally abused: Not on file    Physically abused: Not on file    Forced sexual activity: Not on file  Other Topics Concern  . Not on file  Social History Narrative  . Not on file    Current Outpatient Medications on File Prior to Visit  Medication Sig Dispense Refill  . albuterol (PROVENTIL HFA;VENTOLIN HFA) 108 (90 Base) MCG/ACT inhaler Inhale 1-2 puffs into the lungs every 6 (six) hours as needed.     . fluticasone (FLONASE) 50 MCG/ACT nasal spray Place 2 sprays into both nostrils daily. 16 g 6  .  ibuprofen (ADVIL,MOTRIN) 800 MG tablet Take 1 tablet (800 mg total) by mouth every 8 (eight) hours as needed for moderate pain or cramping. Take with food 60 tablet 1  . levonorgestrel (MIRENA) 20 MCG/24HR IUD 1 each by Intrauterine route once.    . Multiple Vitamins-Minerals (AIRBORNE PO) Take by mouth.    . Multiple Vitamins-Minerals (ONE-A-DAY WOMENS PO) Take by mouth.     No current facility-administered medications on file prior to visit.     No Known Allergies   Review of Systems Constitutional: negative for chills, fatigue, fevers and sweats Eyes: negative for irritation, redness and visual disturbance Ears, nose, mouth, throat, and face: negative for hearing loss, nasal congestion, snoring and tinnitus Respiratory: negative for asthma, cough, sputum Cardiovascular: negative for chest pain, dyspnea, exertional chest pressure/discomfort, irregular heart beat, palpitations and syncope Gastrointestinal: negative for abdominal pain, change in bowel habits, nausea and vomiting Genitourinary:  Negative for abnormal menstrual periods, genital lesions, vaginal discharge, sexual problems and dysuria and urinary incontinence. Positive for vaginal/pelvic pain, episodic (see HPI) Integument/breast: negative for breast lump, breast tenderness and nipple discharge Hematologic/lymphatic: negative for bleeding and easy bruising Musculoskeletal:negative for muscle weakness.  Left wrist pain that sometimes radiates down the forearm.  Neurological: negative for dizziness, headaches, vertigo and weakness Endocrine: negative for diabetic symptoms including polydipsia, polyuria and skin dryness Allergic/Immunologic: negative for hay fever and urticaria        Objective:  Blood pressure 94/64, pulse 83, height 5\' 2"  (1.575 m), weight 149 lb 9.6 oz (67.9 kg), currently breastfeeding. Body mass index is 27.36 kg/m.  General Appearance:    Alert, cooperative, no distress, appears stated age, overweight   Head:    Normocephalic, without obvious abnormality, atraumatic  Eyes:    PERRL, conjunctiva/corneas clear, EOM's intact, both eyes  Ears:    Normal external ear canals, both ears  Nose:   Nares normal, septum midline, mucosa normal, no drainage or sinus tenderness  Throat:   Lips, mucosa, and tongue normal; teeth and gums normal  Neck:   Supple, symmetrical, trachea midline, no adenopathy; thyroid: no enlargement/tenderness/nodules; no carotid bruit or JVD  Back:     Symmetric, no curvature, ROM normal, no CVA tenderness  Lungs:     Clear to auscultation bilaterally, respirations unlabored  Chest Wall:    No tenderness or deformity   Heart:    Regular rate and rhythm, S1 and S2 normal, no  murmur, rub or gallop  Breast Exam:    No tenderness, masses, or nipple abnormality  Abdomen:     Soft, non-tender, bowel sounds active all four quadrants, no masses, no organomegaly.    Genitalia:    Pelvic:external genitalia normal, vagina without lesions, discharge, or tenderness, rectovaginamoderate amount of thin white discharge. Cervix normal in appearance, no cervical motion tenderness, IUD threads visible, ~ 3 cm. No adnexal masses or tenderness.  Uterus normal size, shape, mobile, regular contours, nontender.  Rectal:    Normal external sphincter.  No hemorrhoids appreciated. Internal exam not done.   Extremities:   Extremities normal, atraumatic, no cyanosis or edema. Mild tenderness on palpation of left wrist and forearm.   Pulses:   2+ and symmetric all extremities  Skin:   Skin color, texture, turgor normal, no rashes or lesions  Lymph nodes:   Cervical, supraclavicular, and axillary nodes normal  Neurologic:   CNII-XII intact, normal strength, sensation and reflexes throughout      Labs:  Lab Results  Component Value Date   WBC 6.9 04/10/2018   HGB 13.6 04/10/2018   HCT 40.8 04/10/2018   MCV 83.6 04/10/2018   PLT 239 04/10/2018    Lab Results  Component Value Date   CREATININE  0.61 04/11/2018   BUN 14 04/11/2018   NA 143 04/11/2018   K 4.1 04/11/2018   CL 108 (H) 04/11/2018   CO2 19 (L) 04/11/2018    Lab Results  Component Value Date   ALT 18 04/11/2018   AST 19 04/11/2018   ALKPHOS 64 04/11/2018   BILITOT 0.8 04/11/2018    Lab Results  Component Value Date   TSH 1.09 04/10/2018     Assessment:   Healthy female exam.  Pelvic pain IUD in place Overweight Wrist pain (left)  Plan:     Blood tests: CBC with diff and Comprehensive metabolic panel. Breast self exam technique reviewed and patient encouraged to perform self-exam monthly. Contraception: Mirena IUD. Desires removal due to pain. Would like to resume Depo Provera.  IUD removed today (see procedure note below). Initial dose of Depo Provera given. Advised on back up method x 1 week.  Discussed healthy lifestyle modifications. Pap smear up to date.   Declines STD testing today. Will reassess pelvic/vaginal pain after IUD removed.  If still present after a month, to follow up for further workup.  Wrist pain likely due to repetitive use (patient works a a Radio broadcast assistant). May also be some carpal tunnel.  Discussed care measures.  Follow up in 1 year for annual exam, 3 months for next Depo provera injection.      GYNECOLOGY OFFICE PROCEDURE NOTE  Sherry Malone is a 23 y.o. [id number] here for Mirena IUD removal. GYN concerns include pelvic/vaginal pain.   IUD Removal  Patient identified, informed consent performed, consent signed.  Patient was in the dorsal lithotomy position, normal external genitalia was noted.  A speculum was placed in the patient's vagina, normal discharge was noted, no lesions. The cervix was visualized, no lesions, no abnormal discharge.  The strings of the IUD were grasped and pulled using ring forceps. The IUD was removed in its entirety. Patient tolerated the procedure well.    Patient will use Depo Provera for contraception, first dose given today.  Routine preventative health maintenance measures emphasized.     Hildred Laser, MD [facility].

## 2019-04-24 LAB — COMPREHENSIVE METABOLIC PANEL
ALT: 10 IU/L (ref 0–32)
AST: 12 IU/L (ref 0–40)
Albumin/Globulin Ratio: 1.8 (ref 1.2–2.2)
Albumin: 4.3 g/dL (ref 3.9–5.0)
Alkaline Phosphatase: 45 IU/L (ref 39–117)
BUN/Creatinine Ratio: 11 (ref 9–23)
BUN: 8 mg/dL (ref 6–20)
Bilirubin Total: 0.5 mg/dL (ref 0.0–1.2)
CO2: 19 mmol/L — ABNORMAL LOW (ref 20–29)
Calcium: 8.5 mg/dL — ABNORMAL LOW (ref 8.7–10.2)
Chloride: 109 mmol/L — ABNORMAL HIGH (ref 96–106)
Creatinine, Ser: 0.7 mg/dL (ref 0.57–1.00)
GFR calc Af Amer: 141 mL/min/{1.73_m2} (ref >59)
GFR calc non Af Amer: 123 mL/min/{1.73_m2} (ref >59)
Globulin, Total: 2.4 g/dL (ref 1.5–4.5)
Glucose: 90 mg/dL (ref 65–99)
Potassium: 4 mmol/L (ref 3.5–5.2)
Sodium: 142 mmol/L (ref 134–144)
Total Protein: 6.7 g/dL (ref 6.0–8.5)

## 2019-04-24 LAB — CBC
Hematocrit: 35.6 % (ref 34.0–46.6)
Hemoglobin: 12.6 g/dL (ref 11.1–15.9)
MCH: 28.4 pg (ref 26.6–33.0)
MCHC: 35.4 g/dL (ref 31.5–35.7)
MCV: 80 fL (ref 79–97)
Platelets: 212 10*3/uL (ref 150–450)
RBC: 4.43 x10E6/uL (ref 3.77–5.28)
RDW: 12 % (ref 11.7–15.4)
WBC: 5.8 10*3/uL (ref 3.4–10.8)

## 2019-06-03 ENCOUNTER — Other Ambulatory Visit: Payer: Self-pay

## 2019-06-03 DIAGNOSIS — Z20822 Contact with and (suspected) exposure to covid-19: Secondary | ICD-10-CM

## 2019-06-05 ENCOUNTER — Other Ambulatory Visit: Payer: Self-pay

## 2019-06-05 ENCOUNTER — Encounter: Payer: Self-pay | Admitting: Nurse Practitioner

## 2019-06-05 ENCOUNTER — Ambulatory Visit (INDEPENDENT_AMBULATORY_CARE_PROVIDER_SITE_OTHER): Payer: 59 | Admitting: Nurse Practitioner

## 2019-06-05 VITALS — Temp 99.6°F

## 2019-06-05 DIAGNOSIS — Z20828 Contact with and (suspected) exposure to other viral communicable diseases: Secondary | ICD-10-CM

## 2019-06-05 DIAGNOSIS — Z20822 Contact with and (suspected) exposure to covid-19: Secondary | ICD-10-CM

## 2019-06-05 LAB — NOVEL CORONAVIRUS, NAA: SARS-CoV-2, NAA: NOT DETECTED

## 2019-06-05 MED ORDER — ONDANSETRON HCL 4 MG PO TABS: 4.0000 mg | ORAL_TABLET | Freq: Three times a day (TID) | ORAL | 0 refills | Status: DC | PRN

## 2019-06-05 MED ORDER — ALBUTEROL SULFATE HFA 108 (90 BASE) MCG/ACT IN AERS: 1.0000 | INHALATION_SPRAY | RESPIRATORY_TRACT | 1 refills | Status: AC | PRN

## 2019-06-05 MED ORDER — IPRATROPIUM BROMIDE 0.06 % NA SOLN: 2.0000 | Freq: Four times a day (QID) | NASAL | 12 refills | Status: AC | PRN

## 2019-06-05 NOTE — Progress Notes (Signed)
Telemedicine Encounter: Disclosed to patient at start of encounter that we will provide appropriate telemedicine services.  Patient consents to be treated via phone prior to discussion. - Patient is at her home and is accessed via telephone. - Services are provided by [name] from [facility].  Subjective:    Patient ID: Sherry Malone, female    DOB: 07-04-1996, 23 y.o.   MRN: [id number]  Sherry Malone is a 23 y.o. female presenting on 06/05/2019 for Fever (low- grade fever, bodyaches, nausea, vomiting, runny nose, and SOB x 4days. The pt just had a negative Covid-19 test on yesterday. )  HPI Fever Patient has had negative Covid test.  Patient is monitoring fever.  Highest 101.35F  Now has reduced to 99.68F.  Patient is having some chills - cannot get warm.  No sweats currently.   Bodyaches, nausea, vomiting.  Certain foods are causing vomiting.  Patient drank coffee without success.  Patient has no changes in taste.  Patient is having mild sore throat.  Rhinorrhea, congestion, post nasal drip. Shortness of breath - patient has easy shortness of breath with exertion, but is occurring sooner.  Occasionally feels pressure on chest, feels hard to get air in for a full breath.  Not hard to exhale.   - Patient has been out of work since Tuesday (Covid testing). - Fatigued - "I feel like I ran a marathon, but I've hardly gotten off the couch."  Social History   Tobacco Use  . Smoking status: Former Smoker    Packs/day: 0.25    Types: Cigarettes    Quit date: 04/30/2015    Years since quitting: 4.1  . Smokeless tobacco: Never Used  Substance Use Topics  . Alcohol use: Yes    Comment: rarely  . Drug use: No    Frequency: 9.0 times per week    Review of Systems Per HPI unless specifically indicated above     Objective:    Temp 99.6 F (37.6 C) (Oral)   Wt Readings from Last 3 Encounters:  04/23/19 149 lb 9.6 oz (67.9 kg)  10/15/18 150  lb 4.8 oz (68.2 kg)  10/08/18 154 lb 4 oz (70 kg)    Physical Exam Patient remotely monitored.  Verbal communication appropriate.  Cognition normal.  Results for orders placed or performed in visit on 06/03/19  Novel Coronavirus, NAA ([facility])   Specimen: Oropharyngeal(OP) collection in vial transport medium   OROPHARYNGEA  TESTING  Result Value Ref Range   SARS-CoV-2, NAA Not Detected Not Detected      Assessment & Plan:   Problem List Items Addressed This Visit    None    Visit Diagnoses    Suspected COVID-19 virus infection    -  Primary   Relevant Medications   ipratropium (ATROVENT) 0.06 % nasal spray   albuterol (VENTOLIN HFA) 108 (90 Base) MCG/ACT inhaler   ondansetron (ZOFRAN) 4 MG tablet    Despite negative test, symptoms consistent with covid.  Cannot fully exclude influenza.  Viral illness duration too long for antiflu medications.  Plan: 1. Treat symptomatically with medications - acetaminophen/ibuprofen for aches/fever - Mucinex DM for congestion/cough - Ipratropium for rinorrhea - rx sent - albuterol for shortness of breath - ondansetron for nausea/vomiting 2. Reviewed  Emergency signs and symptoms for seeking ER care (hypoxia concerns reviewed). 3. Follow-up 5-7 days prn worsening, or symptoms not improving. Continue quarantine to prevent spread of Covid until fever free x 3 days and at  least 10 days from onset of symptoms with improvement of symptoms before end of quarantine period.  Meds ordered this encounter  Medications  . ipratropium (ATROVENT) 0.06 % nasal spray    Sig: Place 2 sprays into both nostrils 4 (four) times daily as needed for rhinitis.    Dispense:  15 mL    Refill:  12    Order Specific Question:   Supervising Provider    Answer:   Olin Hauser [[id number]]  . albuterol (VENTOLIN HFA) 108 (90 Base) MCG/ACT inhaler    Sig: Inhale 1-2 puffs into the lungs every 4 (four) hours as needed for wheezing or shortness of breath.     Dispense:  8 g    Refill:  1    Order Specific Question:   Supervising Provider    Answer:   Olin Hauser [[id number]]  . ondansetron (ZOFRAN) 4 MG tablet    Sig: Take 1 tablet (4 mg total) by mouth every 8 (eight) hours as needed for nausea or vomiting.    Dispense:  20 tablet    Refill:  0    Order Specific Question:   Supervising Provider    Answer:   Olin Hauser [[id number]]    - Time spent in direct consultation with patient via telemedicine about above concerns: 16 minutes  Follow up plan: Follow-up 5-7 days prn  Cassell Smiles, DNP, AGPCNP-BC Adult Gerontology Primary Care Nurse Practitioner [facility] 06/05/2019, 4:10 PM

## 2019-06-07 ENCOUNTER — Encounter: Payer: Self-pay | Admitting: Nurse Practitioner

## 2019-06-11 ENCOUNTER — Telehealth: Payer: Self-pay

## 2019-06-11 NOTE — Telephone Encounter (Signed)
The pt called complaining of persistent fever x 6 days averaging between 99.0- 100.5 . The pt currently taking Tylenol three times a day. I  recommended that she continue to treat her symptoms with acetaminophen/ibuprofen for fever. I also recommended that she stay well hydrated and to continue quanrantine until she is fever free for 3 days.  She states that her symptoms have not worsen, but she is just concern for the persistent fever. She state her SOB and mild coughing is still persist, but manageable. Please advise

## 2019-06-11 NOTE — Telephone Encounter (Signed)
Patient called with additional recommendations that if she desires re-evaluation in person, she should seek care in urgent care.  Possible there is another type of infection, but Covid continues to be most likely diagnosis.

## 2019-07-15 NOTE — Progress Notes (Signed)
ERRONEOUS

## 2019-07-16 ENCOUNTER — Other Ambulatory Visit: Payer: Self-pay

## 2019-07-16 ENCOUNTER — Ambulatory Visit (INDEPENDENT_AMBULATORY_CARE_PROVIDER_SITE_OTHER): Payer: 59

## 2019-07-16 DIAGNOSIS — Z3042 Encounter for surveillance of injectable contraceptive: Secondary | ICD-10-CM | POA: Diagnosis not present

## 2019-07-21 ENCOUNTER — Other Ambulatory Visit: Payer: Self-pay

## 2019-07-21 ENCOUNTER — Ambulatory Visit (INDEPENDENT_AMBULATORY_CARE_PROVIDER_SITE_OTHER): Payer: 59 | Admitting: Obstetrics and Gynecology

## 2019-07-21 VITALS — BP 89/58 | HR 89 | Ht 62.0 in | Wt 149.9 lb

## 2019-07-21 DIAGNOSIS — Z3042 Encounter for surveillance of injectable contraceptive: Secondary | ICD-10-CM

## 2019-07-21 MED ORDER — MEDROXYPROGESTERONE ACETATE 150 MG/ML IM SUSP
150.0000 mg | Freq: Once | INTRAMUSCULAR | Status: AC
  Administered 2019-07-21: 150 mg via INTRAMUSCULAR

## 2019-07-21 NOTE — Progress Notes (Signed)
Date last pap: 12/22/2016 (Negative). Last Depo-Provera: 04/23/2019. Side Effects if any: None per patient. Serum HCG indicated? N/A, patient is within dates. Depo-Provera 150 mg IM given by: Jennye Moccasin, CMA. Next appointment due 2/15-10/20/2019.  BP (!) 89/58   Pulse 89   Ht 5\' 2"  (1.575 m)   Wt 149 lb 14.4 oz (68 kg)   LMP  (LMP Unknown)   Breastfeeding No   BMI 27.42 kg/m

## 2019-07-21 NOTE — Progress Notes (Signed)
Patient forgot to bring Depo to 07/16/2019 nurse appointment.  Rescheduled to 07/21/2019.  Chart will be filed.

## 2019-10-13 ENCOUNTER — Ambulatory Visit: Payer: 59

## 2019-10-13 NOTE — Progress Notes (Deleted)
Date last pap: NA Last Depo-Provera: 07/21/2019 Side Effects if any: NA. Serum HCG indicated? NA Depo-Provera 150 mg IM given by: ***. Next appointment due 12/29/2019-01/12/2020

## 2019-10-15 ENCOUNTER — Ambulatory Visit (INDEPENDENT_AMBULATORY_CARE_PROVIDER_SITE_OTHER): Payer: 59 | Admitting: Obstetrics and Gynecology

## 2019-10-15 ENCOUNTER — Other Ambulatory Visit: Payer: Self-pay

## 2019-10-15 VITALS — BP 97/56 | HR 86 | Ht 62.0 in | Wt 156.7 lb

## 2019-10-15 DIAGNOSIS — Z3042 Encounter for surveillance of injectable contraceptive: Secondary | ICD-10-CM | POA: Diagnosis not present

## 2019-10-15 MED ORDER — MEDROXYPROGESTERONE ACETATE 150 MG/ML IM SUSP
150.0000 mg | Freq: Once | INTRAMUSCULAR | Status: AC
  Administered 2019-10-15: 150 mg via INTRAMUSCULAR

## 2019-10-15 NOTE — Progress Notes (Signed)
Date last pap: 12/22/2016 (Negative). Last Depo-Provera: 07/21/2019. Side Effects if any: None per paitent. Serum HCG indicated? N/A, patient is within dates. Depo-Provera 150 mg IM given by: Park Meo, CMA. Next appointment due 5/12-5/26/2021.  BP (!) 97/56   Pulse 86   Ht 5\' 2"  (1.575 m)   Wt 156 lb 11.2 oz (71.1 kg)   LMP  (LMP Unknown)   BMI 28.66 kg/m

## 2020-01-12 ENCOUNTER — Other Ambulatory Visit: Payer: Self-pay

## 2020-01-12 ENCOUNTER — Ambulatory Visit (INDEPENDENT_AMBULATORY_CARE_PROVIDER_SITE_OTHER): Payer: 59 | Admitting: Obstetrics and Gynecology

## 2020-01-12 ENCOUNTER — Ambulatory Visit: Payer: 59

## 2020-01-12 DIAGNOSIS — Z3042 Encounter for surveillance of injectable contraceptive: Secondary | ICD-10-CM | POA: Diagnosis not present

## 2020-01-12 MED ORDER — MEDROXYPROGESTERONE ACETATE 150 MG/ML IM SUSP
150.0000 mg | Freq: Once | INTRAMUSCULAR | Status: AC
  Administered 2020-01-12: 150 mg via INTRAMUSCULAR

## 2020-01-12 NOTE — Progress Notes (Signed)
Date last pap: 12/22/2016. Last Depo-Provera: 10/15/2019. Side Effects if any: none. Serum HCG indicated? n/a. Depo-Provera 150 mg IM given by: Shawn Route, LPN. Next appointment due August 9-23, 2021.

## 2020-01-23 ENCOUNTER — Ambulatory Visit: Payer: 59

## 2020-02-19 DIAGNOSIS — Z419 Encounter for procedure for purposes other than remedying health state, unspecified: Secondary | ICD-10-CM | POA: Diagnosis not present

## 2020-02-20 DIAGNOSIS — Z20822 Contact with and (suspected) exposure to covid-19: Secondary | ICD-10-CM | POA: Diagnosis not present

## 2020-03-21 DIAGNOSIS — Z419 Encounter for procedure for purposes other than remedying health state, unspecified: Secondary | ICD-10-CM | POA: Diagnosis not present

## 2020-03-31 ENCOUNTER — Other Ambulatory Visit: Payer: Self-pay | Admitting: Obstetrics and Gynecology

## 2020-04-07 ENCOUNTER — Ambulatory Visit: Payer: 59

## 2020-04-08 ENCOUNTER — Ambulatory Visit: Payer: 59

## 2020-04-12 ENCOUNTER — Other Ambulatory Visit: Payer: Self-pay

## 2020-04-12 ENCOUNTER — Ambulatory Visit (INDEPENDENT_AMBULATORY_CARE_PROVIDER_SITE_OTHER): Payer: Medicaid Other | Admitting: Surgical

## 2020-04-12 DIAGNOSIS — Z3042 Encounter for surveillance of injectable contraceptive: Secondary | ICD-10-CM | POA: Diagnosis not present

## 2020-04-12 MED ORDER — MEDROXYPROGESTERONE ACETATE 150 MG/ML IM SUSP
150.0000 mg | Freq: Once | INTRAMUSCULAR | Status: AC
  Administered 2020-04-12: 150 mg via INTRAMUSCULAR

## 2020-04-12 NOTE — Progress Notes (Signed)
Date last pap: 12/22/2016 Last Depo-Provera:01/12/2020  Side Effects if any: None Serum HCG indicated? NA Depo-Provera 150 mg IM given by: J. [facility] CMA Next appointment due 06/28/2020-07/12/2020

## 2020-04-21 DIAGNOSIS — Z419 Encounter for procedure for purposes other than remedying health state, unspecified: Secondary | ICD-10-CM | POA: Diagnosis not present

## 2020-05-19 ENCOUNTER — Encounter: Payer: 59 | Admitting: Obstetrics and Gynecology

## 2020-05-21 DIAGNOSIS — Z419 Encounter for procedure for purposes other than remedying health state, unspecified: Secondary | ICD-10-CM | POA: Diagnosis not present

## 2020-06-21 DIAGNOSIS — Z419 Encounter for procedure for purposes other than remedying health state, unspecified: Secondary | ICD-10-CM | POA: Diagnosis not present

## 2020-06-30 ENCOUNTER — Other Ambulatory Visit: Payer: Self-pay | Admitting: Obstetrics and Gynecology

## 2020-06-30 ENCOUNTER — Encounter: Payer: Medicaid Other | Admitting: Obstetrics and Gynecology

## 2020-06-30 NOTE — Progress Notes (Deleted)
Pt present for annual exam and depo injection.

## 2020-06-30 NOTE — Telephone Encounter (Signed)
Tired to call pt her phone rings one time and just its silence.

## 2020-06-30 NOTE — Telephone Encounter (Signed)
Pt called in and stated that she is at the pharmacy waiting on her depo. The pt had an appointment for a annual and depo injection at 10. I told the pt I will send a message for the refill on her depo. I told the pt that since its 10:05 we are going to reschedule. The pt rescheduled her depo and her injection. I called back to tell the nurse the pt fell off her schedule. FH said she will talk to Dr. Valentino Saxon. FH Said to call the pf and see if she can do 4:15 today  At 10:12am Called pt [facility] for this afternoon at 4:15 for a depo and  annual appt.

## 2020-07-05 ENCOUNTER — Ambulatory Visit: Payer: Medicaid Other

## 2020-07-06 ENCOUNTER — Ambulatory Visit (INDEPENDENT_AMBULATORY_CARE_PROVIDER_SITE_OTHER): Payer: Medicaid Other | Admitting: Obstetrics and Gynecology

## 2020-07-06 ENCOUNTER — Other Ambulatory Visit: Payer: Self-pay

## 2020-07-06 DIAGNOSIS — Z3042 Encounter for surveillance of injectable contraceptive: Secondary | ICD-10-CM | POA: Diagnosis not present

## 2020-07-06 MED ORDER — MEDROXYPROGESTERONE ACETATE 150 MG/ML IM SUSP
150.0000 mg | Freq: Once | INTRAMUSCULAR | Status: AC
  Administered 2020-07-06: 150 mg via INTRAMUSCULAR

## 2020-07-06 NOTE — Progress Notes (Signed)
Date last pap: 12/22/2016 Last Depo-Provera: 04/12/20. Side Effects if any: n/a Serum HCG indicated? n/a Depo-Provera 150 mg IM given by: Darrold Junker, CMA Next appointment due 09/21/20-10/05/20

## 2020-07-07 ENCOUNTER — Ambulatory Visit: Payer: Medicaid Other

## 2020-07-21 DIAGNOSIS — Z419 Encounter for procedure for purposes other than remedying health state, unspecified: Secondary | ICD-10-CM | POA: Diagnosis not present

## 2020-07-28 ENCOUNTER — Encounter: Payer: 59 | Admitting: Obstetrics and Gynecology

## 2020-08-09 ENCOUNTER — Other Ambulatory Visit: Payer: Self-pay | Admitting: Nurse Practitioner

## 2020-08-09 DIAGNOSIS — Z20822 Contact with and (suspected) exposure to covid-19: Secondary | ICD-10-CM

## 2020-08-10 NOTE — Telephone Encounter (Signed)
Requested medication (s) are due for refill today:   Yes  Requested medication (s) are on the active medication list:   Yes  Future visit scheduled:   No   Last ordered: 06/05/2019 8g, 1 refill  Returned because pharmacy requesting a DX CODE.   Requested Prescriptions  Pending Prescriptions Disp Refills   albuterol (VENTOLIN HFA) 108 (90 Base) MCG/ACT inhaler [Pharmacy Med Name: ALBUTEROL HFA (PROAIR) INHALER] 8.5 each 1    Sig: Inhale 1-2 puffs into the lungs every 4 (four) hours as needed for wheezing or shortness of breath.      Pulmonology:  Beta Agonists Failed - 08/09/2020 10:45 AM      Failed - One inhaler should last at least one month. If the patient is requesting refills earlier, contact the patient to check for uncontrolled symptoms.      Failed - Valid encounter within last 12 months    Recent Outpatient Visits           1 year ago Suspected COVID-19 virus infection   [facility] Kyung Rudd, Alison Stalling, NP   2 years ago Acute non-recurrent pansinusitis   [facility] Kyung Rudd, Alison Stalling, NP   2 years ago Encounter for annual physical exam   [facility] Galen Manila, NP   2 years ago Non-recurrent acute serous otitis media of right ear   [facility] Galen Manila, NP       Future Appointments             In 1 month Hildred Laser, MD [facility]

## 2020-08-21 DIAGNOSIS — Z419 Encounter for procedure for purposes other than remedying health state, unspecified: Secondary | ICD-10-CM | POA: Diagnosis not present

## 2020-09-14 ENCOUNTER — Encounter: Payer: Medicaid Other | Admitting: Obstetrics and Gynecology

## 2020-09-19 ENCOUNTER — Other Ambulatory Visit: Payer: Self-pay | Admitting: Obstetrics and Gynecology

## 2020-09-21 ENCOUNTER — Other Ambulatory Visit (HOSPITAL_COMMUNITY)
Admission: RE | Admit: 2020-09-21 | Discharge: 2020-09-21 | Disposition: A | Discharge status: Home / Self Care | Payer: BLUE CROSS/BLUE SHIELD | Source: Ambulatory Visit | Attending: Obstetrics and Gynecology | Admitting: Obstetrics and Gynecology

## 2020-09-21 ENCOUNTER — Other Ambulatory Visit: Payer: Self-pay

## 2020-09-21 ENCOUNTER — Encounter: Payer: Self-pay | Admitting: Obstetrics and Gynecology

## 2020-09-21 ENCOUNTER — Ambulatory Visit (INDEPENDENT_AMBULATORY_CARE_PROVIDER_SITE_OTHER): Payer: 59 | Admitting: Obstetrics and Gynecology

## 2020-09-21 VITALS — BP 86/56 | HR 91 | Ht 62.0 in | Wt 164.8 lb

## 2020-09-21 DIAGNOSIS — Z3042 Encounter for surveillance of injectable contraceptive: Secondary | ICD-10-CM | POA: Diagnosis not present

## 2020-09-21 DIAGNOSIS — E669 Obesity, unspecified: Secondary | ICD-10-CM | POA: Diagnosis not present

## 2020-09-21 DIAGNOSIS — Z124 Encounter for screening for malignant neoplasm of cervix: Secondary | ICD-10-CM | POA: Diagnosis not present

## 2020-09-21 DIAGNOSIS — Z01419 Encounter for gynecological examination (general) (routine) without abnormal findings: Secondary | ICD-10-CM | POA: Diagnosis not present

## 2020-09-21 DIAGNOSIS — Z419 Encounter for procedure for purposes other than remedying health state, unspecified: Secondary | ICD-10-CM | POA: Diagnosis not present

## 2020-09-21 DIAGNOSIS — Z7185 Encounter for immunization safety counseling: Secondary | ICD-10-CM

## 2020-09-21 MED ORDER — MEDROXYPROGESTERONE ACETATE 150 MG/ML IM SUSP: 150.0000 mg | INTRAMUSCULAR | 4 refills | Status: DC

## 2020-09-21 MED ORDER — MEDROXYPROGESTERONE ACETATE 150 MG/ML IM SUSP
150.0000 mg | Freq: Once | INTRAMUSCULAR | Status: AC
  Administered 2020-09-21: 150 mg via INTRAMUSCULAR

## 2020-09-21 NOTE — Progress Notes (Signed)
Pt present for annual exam and depo injection. Pt stated that she was doing well. Date last pap: 12/22/2016. Last Depo-Provera: 06/26/2020. Side Effects if any: n/a. Serum HCG indicated? none. Depo-Provera 150 mg IM given by: Shawn Route, LPN. Next appointment due April 19-May 2022.

## 2020-09-21 NOTE — Patient Instructions (Signed)
Preventive Care 21-25 Years Old, Female Preventive care refers to lifestyle choices and visits with your health care provider that can promote health and wellness. This includes:  A yearly physical exam. This is also called an annual wellness visit.  Regular dental and eye exams.  Immunizations.  Screening for certain conditions.  Healthy lifestyle choices, such as: ? Eating a healthy diet. ? Getting regular exercise. ? Not using drugs or products that contain nicotine and tobacco. ? Limiting alcohol use. What can I expect for my preventive care visit? Physical exam Your health care provider may check your:  Height and weight. These may be used to calculate your BMI (body mass index). BMI is a measurement that tells if you are at a healthy weight.  Heart rate and blood pressure.  Body temperature.  Skin for abnormal spots. Counseling Your health care provider may ask you questions about your:  Past medical problems.  Family's medical history.  Alcohol, tobacco, and drug use.  Emotional well-being.  Home life and relationship well-being.  Sexual activity.  Diet, exercise, and sleep habits.  Work and work environment.  Access to firearms.  Method of birth control.  Menstrual cycle.  Pregnancy history. What immunizations do I need? Vaccines are usually given at various ages, according to a schedule. Your health care provider will recommend vaccines for you based on your age, medical history, and lifestyle or other factors, such as travel or where you work.   What tests do I need? Blood tests  Lipid and cholesterol levels. These may be checked every 5 years starting at age 20.  Hepatitis C test.  Hepatitis B test. Screening  Diabetes screening. This is done by checking your blood sugar (glucose) after you have not eaten for a while (fasting).  STD (sexually transmitted disease) testing, if you are at risk.  BRCA-related cancer screening. This may be  done if you have a family history of breast, ovarian, tubal, or peritoneal cancers.  Pelvic exam and Pap test. This may be done every 3 years starting at age 21. Starting at age 30, this may be done every 5 years if you have a Pap test in combination with an HPV test. Talk with your health care provider about your test results, treatment options, and if necessary, the need for more tests.   Follow these instructions at home: Eating and drinking  Eat a healthy diet that includes fresh fruits and vegetables, whole grains, lean protein, and low-fat dairy products.  Take vitamin and mineral supplements as recommended by your health care provider.  Do not drink alcohol if: ? Your health care provider tells you not to drink. ? You are pregnant, may be pregnant, or are planning to become pregnant.  If you drink alcohol: ? Limit how much you have to 0-1 drink a day. ? Be aware of how much alcohol is in your drink. In the [place] one drink equals one 12 oz bottle of beer (355 mL), one 5 oz glass of wine (148 mL), or one 1 oz glass of hard liquor (44 mL).   Lifestyle  Take daily care of your teeth and gums. Brush your teeth every morning and night with fluoride toothpaste. Floss one time each day.  Stay active. Exercise for at least 30 minutes 5 or more days each week.  Do not use any products that contain nicotine or tobacco, such as cigarettes, e-cigarettes, and chewing tobacco. If you need help quitting, ask your health care provider.  Do not   use drugs.  If you are sexually active, practice safe sex. Use a condom or other form of protection to prevent STIs (sexually transmitted infections).  If you do not wish to become pregnant, use a form of birth control. If you plan to become pregnant, see your health care provider for a prepregnancy visit.  Find healthy ways to cope with stress, such as: ? Meditation, yoga, or listening to music. ? Journaling. ? Talking to a trusted  person. ? Spending time with friends and family. Safety  Always wear your seat belt while driving or riding in a vehicle.  Do not drive: ? If you have been drinking alcohol. Do not ride with someone who has been drinking. ? When you are tired or distracted. ? While texting.  Wear a helmet and other protective equipment during sports activities.  If you have firearms in your house, make sure you follow all gun safety procedures.  Seek help if you have been physically or sexually abused. What's next?  Go to your health care provider once a year for an annual wellness visit.  Ask your health care provider how often you should have your eyes and teeth checked.  Stay up to date on all vaccines. This information is not intended to replace advice given to you by your health care provider. Make sure you discuss any questions you have with your health care provider. Document Revised: 04/04/2020 Document Reviewed: 04/18/2018 Elsevier Patient Education  2021 [employer].

## 2020-09-21 NOTE — Progress Notes (Signed)
GYNECOLOGY ANNUAL PHYSICAL EXAM PROGRESS NOTE  Subjective:    Sherry Malone is a 25 y.o. G60P2002 female who presents for an annual exam and Depo Provera injection.  The patient is sexually active. The patient wears seatbelts: yes. The patient participates in regular exercise: no. Has the patient ever been transfused or tattooed?: yes- professional tattoos. The patient reports that there is not domestic violence in her life.    The patient has the following complaints today complaints today:  1. None  Gynecologic History Menarche age: 60.  No LMP recorded. Patient has had an injection. Contraception: Depo-Provera injections History of STI's: H/o trichomoniasis Last Pap: 12/22/2016. Results were: normal.  Denies h/o abnormal pap smears.     Upstream - 09/21/20 1012      Pregnancy Intention Screening   Does the patient want to become pregnant in the next year? Ok Either Way    Does the patient's partner want to become pregnant in the next year? Yes    Would the patient like to discuss contraceptive options today? No      Contraception Wrap Up   Current Method Hormonal Injection          The pregnancy intention screening data noted above was reviewed. Potential methods of contraception were not discussed. The patient elected to continue with Hormonal Injection.   OB History  Gravida Para Term Preterm AB Living  2 2 2  0 0 2  SAB IAB Ectopic Multiple Live Births  0 0 0 0 2    # Outcome Date GA Lbr Len/2nd Weight Sex Delivery Anes PTL Lv  2 Term 07/04/17 [redacted]w[redacted]d / 00:24 7 lb 0.9 oz (3.2 kg) F Vag-Spont None  LIV     Name: [redacted]w[redacted]d     Apgar1: 8  Apgar5: 9  1 Term 10/26/15 [redacted]w[redacted]d 20:15 / 00:26 6 lb 4.2 oz (2.84 kg) F Vag-Forceps EPI  LIV     Name: [name]     Apgar1: 10  Apgar5: 9    Past Medical History:  Diagnosis Date  . Asthma   . History of trichomoniasis 01/16/2017     Past Surgical History:  Procedure Laterality Date  . CHOLECYSTECTOMY N/A 05/15/2016    Procedure: LAPAROSCOPIC CHOLECYSTECTOMY;  Surgeon: 05/17/2016, MD;  Location: [facility] ORS;  Service: General;  Laterality: N/A;  . NO PAST SURGERIES      Family History  Problem Relation Age of Onset  . Vision loss Mother   . Hearing loss Mother   . Hypertension Father   . Diabetes Maternal Grandmother   . Kidney disease Maternal Grandmother   . Kidney failure Maternal Grandmother   . Gallstones Other   . Breast cancer Neg Hx   . Ovarian cancer Neg Hx   . Colon cancer Neg Hx     Social History   Socioeconomic History  . Marital status: Single    Spouse name: Not on file  . Number of children: Not on file  . Years of education: Not on file  . Highest education level: Not on file  Occupational History  . Not on file  Tobacco Use  . Smoking status: Former Smoker    Packs/day: 0.25    Types: Cigarettes    Quit date: 04/30/2015    Years since quitting: 5.4  . Smokeless tobacco: Never Used  Vaping Use  . Vaping Use: Some days  Substance and Sexual Activity  . Alcohol use: Yes    Comment: rarely  .  Drug use: Not Currently    Frequency: 9.0 times per week    Types: Marijuana  . Sexual activity: Yes    Birth control/protection: Injection  Other Topics Concern  . Not on file  Social History Narrative  . Not on file   Social Determinants of Health   Financial Resource Strain: Not on file  Food Insecurity: Not on file  Transportation Needs: Not on file  Physical Activity: Not on file  Stress: Not on file  Social Connections: Not on file  Intimate Partner Violence: Not on file    Current Outpatient Medications on File Prior to Visit  Medication Sig Dispense Refill  . acetaminophen (TYLENOL) 500 MG tablet Take 1,000 mg by mouth every 6 (six) hours as needed.    Marland Kitchen albuterol (VENTOLIN HFA) 108 (90 Base) MCG/ACT inhaler Inhale 1-2 puffs into the lungs every 4 (four) hours as needed for wheezing or shortness of breath. 8 g 1  . ipratropium (ATROVENT) 0.06 % nasal spray  Place 2 sprays into both nostrils 4 (four) times daily as needed for rhinitis. 15 mL 12  . medroxyPROGESTERone (DEPO-PROVERA) 150 MG/ML injection INJECT 1 ML (150 MG TOTAL) INTO THE MUSCLE EVERY 3 (THREE) MONTHS. 1 mL 0  . Multiple Vitamins-Minerals (ONE-A-DAY WOMENS PO) Take by mouth.    . ondansetron (ZOFRAN) 4 MG tablet Take 1 tablet (4 mg total) by mouth every 8 (eight) hours as needed for nausea or vomiting. 20 tablet 0   No current facility-administered medications on file prior to visit.    No Known Allergies   Review of Systems Constitutional: negative for chills, fatigue, fevers and sweats Eyes: negative for irritation, redness and visual disturbance Ears, nose, mouth, throat, and face: negative for hearing loss, nasal congestion, snoring and tinnitus Respiratory: negative for asthma, cough, sputum Cardiovascular: negative for chest pain, dyspnea, exertional chest pressure/discomfort, irregular heart beat, palpitations and syncope Gastrointestinal: negative for abdominal pain, change in bowel habits, nausea and vomiting Genitourinary:  Negative for abnormal menstrual periods, genital lesions, vaginal discharge, sexual problems and dysuria and urinary incontinence. Positive for vaginal/pelvic pain, episodic (see HPI) Integument/breast: negative for breast lump, breast tenderness and nipple discharge Hematologic/lymphatic: negative for bleeding and easy bruising Musculoskeletal:negative for muscle weakness.  Left wrist pain that sometimes radiates down the forearm.  Neurological: negative for dizziness, headaches, vertigo and weakness Endocrine: negative for diabetic symptoms including polydipsia, polyuria and skin dryness Allergic/Immunologic: negative for hay fever and urticaria        Objective:  Blood pressure (!) 86/56, pulse 91, height 5\' 2"  (1.575 m), weight 164 lb 12.8 oz (74.8 kg). Body mass index is 30.14 kg/m.  General Appearance:    Alert, cooperative, no distress,  appears stated age, obesity, mild  Head:    Normocephalic, without obvious abnormality, atraumatic  Eyes:    PERRL, conjunctiva/corneas clear, EOM's intact, both eyes  Ears:    Normal external ear canals, both ears  Nose:   Nares normal, septum midline, mucosa normal, no drainage or sinus tenderness  Throat:   Lips, mucosa, and tongue normal; teeth and gums normal  Neck:   Supple, symmetrical, trachea midline, no adenopathy; thyroid: no enlargement/tenderness/nodules; no carotid bruit or JVD  Back:     Symmetric, no curvature, ROM normal, no CVA tenderness  Lungs:     Clear to auscultation bilaterally, respirations unlabored  Chest Wall:    No tenderness or deformity   Heart:    Regular rate and rhythm, S1 and S2 normal, no  murmur, rub or gallop  Breast Exam:    No tenderness, masses, or nipple abnormality  Abdomen:     Soft, non-tender, bowel sounds active all four quadrants, no masses, no organomegaly.    Genitalia:    Pelvic:external genitalia normal, vagina without lesions, discharge, or tenderness, rectovaginamoderate amount of thin white discharge. Cervix normal in appearance, no cervical motion tenderness, IUD threads visible, ~ 3 cm. No adnexal masses or tenderness.  Uterus normal size, shape, mobile, regular contours, nontender.  Rectal:    Normal external sphincter.  No hemorrhoids appreciated. Internal exam not done.   Extremities:   Extremities normal, atraumatic, no cyanosis or edema. Mild tenderness on palpation of left wrist and forearm.   Pulses:   2+ and symmetric all extremities  Skin:   Skin color, texture, turgor normal, no rashes or lesions  Lymph nodes:   Cervical, supraclavicular, and axillary nodes normal  Neurologic:   CNII-XII intact, normal strength, sensation and reflexes throughout      Labs:  Lab Results  Component Value Date   WBC 5.8 04/23/2019   HGB 12.6 04/23/2019   HCT 35.6 04/23/2019   MCV 80 04/23/2019   PLT 212 04/23/2019    Lab Results   Component Value Date   CREATININE 0.70 04/23/2019   BUN 8 04/23/2019   NA 142 04/23/2019   K 4.0 04/23/2019   CL 109 (H) 04/23/2019   CO2 19 (L) 04/23/2019    Lab Results  Component Value Date   ALT 10 04/23/2019   AST 12 04/23/2019   ALKPHOS 45 04/23/2019   BILITOT 0.5 04/23/2019    Lab Results  Component Value Date   TSH 1.09 04/10/2018     Assessment:   Healthy female exam.  Depo Provera contraceptive status Obesity (Class I)  Plan:    Blood tests: see orders Breast self exam technique reviewed and patient encouraged to perform self-exam monthly. Contraception:Depo Provera. Injection given today.  Initiated use in 04/2019. Discussed that after 3 years of use would require Dexa Scan for assessment of reversible bone loss.  Discussed healthy lifestyle modifications. Pap smear performed today.  Declines STD testing today. Flu vaccine: declined.  COVID vaccination: unsure. Vaccine counseling performed today.  Follow up in 1 year for annual exam, 3 months for next Depo provera injection.      Hildred Laser, MD [facility].

## 2020-09-22 LAB — COMPREHENSIVE METABOLIC PANEL
ALT: 12 IU/L (ref 0–32)
AST: 17 IU/L (ref 0–40)
Albumin/Globulin Ratio: 1.9 (ref 1.2–2.2)
Albumin: 4.5 g/dL (ref 3.9–5.0)
Alkaline Phosphatase: 48 IU/L (ref 44–121)
BUN/Creatinine Ratio: 11 (ref 9–23)
BUN: 8 mg/dL (ref 6–20)
Bilirubin Total: 0.7 mg/dL (ref 0.0–1.2)
CO2: 22 mmol/L (ref 20–29)
Calcium: 8.9 mg/dL (ref 8.7–10.2)
Chloride: 106 mmol/L (ref 96–106)
Creatinine, Ser: 0.7 mg/dL (ref 0.57–1.00)
GFR calc Af Amer: 139 mL/min/{1.73_m2} (ref >59)
GFR calc non Af Amer: 121 mL/min/{1.73_m2} (ref >59)
Globulin, Total: 2.4 g/dL (ref 1.5–4.5)
Glucose: 85 mg/dL (ref 65–99)
Potassium: 4.2 mmol/L (ref 3.5–5.2)
Sodium: 142 mmol/L (ref 134–144)
Total Protein: 6.9 g/dL (ref 6.0–8.5)

## 2020-09-22 LAB — CBC
Hematocrit: 41.2 % (ref 34.0–46.6)
Hemoglobin: 13.8 g/dL (ref 11.1–15.9)
MCH: 28.2 pg (ref 26.6–33.0)
MCHC: 33.5 g/dL (ref 31.5–35.7)
MCV: 84 fL (ref 79–97)
Platelets: 242 10*3/uL (ref 150–450)
RBC: 4.89 x10E6/uL (ref 3.77–5.28)
RDW: 13 % (ref 11.7–15.4)
WBC: 6.5 10*3/uL (ref 3.4–10.8)

## 2020-09-22 LAB — LIPID PANEL
Chol/HDL Ratio: 3.1 ratio (ref 0.0–4.4)
Cholesterol, Total: 148 mg/dL (ref 100–199)
HDL: 47 mg/dL (ref >39)
LDL Chol Calc (NIH): 83 mg/dL (ref 0–99)
Triglycerides: 95 mg/dL (ref 0–149)
VLDL Cholesterol Cal: 18 mg/dL (ref 5–40)

## 2020-09-22 LAB — HEMOGLOBIN A1C
Est. average glucose Bld gHb Est-mCnc: 94 mg/dL
Hgb A1c MFr Bld: 4.9 % (ref 4.8–5.6)

## 2020-09-22 LAB — TSH: TSH: 0.628 u[IU]/mL (ref 0.450–4.500)

## 2020-09-26 LAB — CYTOLOGY - PAP
Comment: NEGATIVE
Diagnosis: UNDETERMINED — AB
High risk HPV: NEGATIVE

## 2020-10-19 DIAGNOSIS — Z419 Encounter for procedure for purposes other than remedying health state, unspecified: Secondary | ICD-10-CM | POA: Diagnosis not present

## 2020-11-02 ENCOUNTER — Encounter: Payer: Medicaid Other | Admitting: Obstetrics and Gynecology

## 2020-11-09 ENCOUNTER — Encounter: Payer: Self-pay | Admitting: Obstetrics and Gynecology

## 2020-11-19 DIAGNOSIS — Z419 Encounter for procedure for purposes other than remedying health state, unspecified: Secondary | ICD-10-CM | POA: Diagnosis not present

## 2020-12-09 ENCOUNTER — Ambulatory Visit: Payer: Medicaid Other

## 2020-12-10 ENCOUNTER — Ambulatory Visit: Payer: Medicaid Other

## 2020-12-16 ENCOUNTER — Ambulatory Visit (INDEPENDENT_AMBULATORY_CARE_PROVIDER_SITE_OTHER): Payer: 59 | Admitting: Obstetrics and Gynecology

## 2020-12-16 ENCOUNTER — Other Ambulatory Visit: Payer: Self-pay

## 2020-12-16 DIAGNOSIS — Z3042 Encounter for surveillance of injectable contraceptive: Secondary | ICD-10-CM | POA: Diagnosis not present

## 2020-12-16 MED ORDER — MEDROXYPROGESTERONE ACETATE 150 MG/ML IM SUSP
150.0000 mg | Freq: Once | INTRAMUSCULAR | Status: AC
  Administered 2020-12-16: 150 mg via INTRAMUSCULAR

## 2020-12-16 NOTE — Progress Notes (Signed)
Date last pap: 09/21/2020. Last Depo-Provera: 09/21/2020 Side Effects if any: n/a Serum HCG indicated? n/a. Depo-Provera 150 mg IM given by: CM Next appointment due 7/14-7/28.

## 2020-12-19 DIAGNOSIS — Z419 Encounter for procedure for purposes other than remedying health state, unspecified: Secondary | ICD-10-CM | POA: Diagnosis not present

## 2020-12-22 ENCOUNTER — Encounter: Payer: Self-pay | Admitting: Obstetrics and Gynecology

## 2021-01-19 DIAGNOSIS — Z419 Encounter for procedure for purposes other than remedying health state, unspecified: Secondary | ICD-10-CM | POA: Diagnosis not present

## 2021-02-18 DIAGNOSIS — Z419 Encounter for procedure for purposes other than remedying health state, unspecified: Secondary | ICD-10-CM | POA: Diagnosis not present

## 2021-03-04 ENCOUNTER — Other Ambulatory Visit: Payer: Self-pay

## 2021-03-04 ENCOUNTER — Ambulatory Visit (INDEPENDENT_AMBULATORY_CARE_PROVIDER_SITE_OTHER): Payer: 59

## 2021-03-04 DIAGNOSIS — Z3042 Encounter for surveillance of injectable contraceptive: Secondary | ICD-10-CM | POA: Diagnosis not present

## 2021-03-04 MED ORDER — MEDROXYPROGESTERONE ACETATE 150 MG/ML IM SUSP
150.0000 mg | Freq: Once | INTRAMUSCULAR | Status: AC
  Administered 2021-03-04: 150 mg via INTRAMUSCULAR

## 2021-03-04 NOTE — Progress Notes (Signed)
Date last pap: 09/21/2020. Last Depo-Provera: 12/16/2020. Side Effects if any: none. Serum HCG indicated? N/A. Depo-Provera 150 mg IM given by: Shawn Route, LPN. Next appointment due September 30-Jun 03, 2021.   Patient supplied depo injection.

## 2021-03-21 DIAGNOSIS — Z419 Encounter for procedure for purposes other than remedying health state, unspecified: Secondary | ICD-10-CM | POA: Diagnosis not present

## 2021-04-21 DIAGNOSIS — Z419 Encounter for procedure for purposes other than remedying health state, unspecified: Secondary | ICD-10-CM | POA: Diagnosis not present

## 2021-05-21 DIAGNOSIS — Z419 Encounter for procedure for purposes other than remedying health state, unspecified: Secondary | ICD-10-CM | POA: Diagnosis not present

## 2021-05-23 ENCOUNTER — Ambulatory Visit: Payer: 59

## 2021-05-23 ENCOUNTER — Ambulatory Visit (INDEPENDENT_AMBULATORY_CARE_PROVIDER_SITE_OTHER): Payer: Medicaid Other

## 2021-05-23 ENCOUNTER — Other Ambulatory Visit: Payer: Self-pay

## 2021-05-23 VITALS — Ht 62.0 in

## 2021-05-23 DIAGNOSIS — Z3042 Encounter for surveillance of injectable contraceptive: Secondary | ICD-10-CM

## 2021-05-23 MED ORDER — MEDROXYPROGESTERONE ACETATE 150 MG/ML IM SUSP
150.0000 mg | Freq: Once | INTRAMUSCULAR | Status: AC
  Administered 2021-05-23: 150 mg via INTRAMUSCULAR

## 2021-05-23 NOTE — Progress Notes (Signed)
Date last pap: 09/21/2020. Last Depo-Provera: 03/04/2021. Side Effects if any: none. Serum HCG indicated? N/A. Depo-Provera 150 mg IM given by: Shawn Route, LPN. Next appointment due  August 08, 2021-August 22, 2021.  Patient supplied depo injection.

## 2021-06-21 DIAGNOSIS — Z419 Encounter for procedure for purposes other than remedying health state, unspecified: Secondary | ICD-10-CM | POA: Diagnosis not present

## 2021-07-18 DIAGNOSIS — J111 Influenza due to unidentified influenza virus with other respiratory manifestations: Secondary | ICD-10-CM | POA: Diagnosis not present

## 2021-07-18 DIAGNOSIS — Z20822 Contact with and (suspected) exposure to covid-19: Secondary | ICD-10-CM | POA: Diagnosis not present

## 2021-07-18 DIAGNOSIS — R059 Cough, unspecified: Secondary | ICD-10-CM | POA: Diagnosis not present

## 2021-07-21 DIAGNOSIS — Z419 Encounter for procedure for purposes other than remedying health state, unspecified: Secondary | ICD-10-CM | POA: Diagnosis not present

## 2021-08-08 ENCOUNTER — Ambulatory Visit: Payer: Medicaid Other

## 2021-08-10 NOTE — Progress Notes (Signed)
Date last pap: 09/21/2020. Last Depo-Provera: 05/23/2021 Side Effects if any: Weight Gain Serum HCG indicated? N/A. Depo-Provera 150 mg IM given by: Santiago Bumpers, CMA. Next appointment due: March 9 - March 23.

## 2021-08-11 ENCOUNTER — Ambulatory Visit: Payer: Medicaid Other

## 2021-08-11 ENCOUNTER — Other Ambulatory Visit: Payer: Self-pay

## 2021-08-11 ENCOUNTER — Ambulatory Visit (INDEPENDENT_AMBULATORY_CARE_PROVIDER_SITE_OTHER): Payer: Medicaid Other | Admitting: Obstetrics and Gynecology

## 2021-08-11 DIAGNOSIS — Z3042 Encounter for surveillance of injectable contraceptive: Secondary | ICD-10-CM

## 2021-08-11 MED ORDER — MEDROXYPROGESTERONE ACETATE 150 MG/ML IM SUSP
150.0000 mg | INTRAMUSCULAR | Status: AC
  Administered 2021-08-11 – 2022-04-07 (×2): 150 mg via INTRAMUSCULAR

## 2021-08-21 DIAGNOSIS — Z419 Encounter for procedure for purposes other than remedying health state, unspecified: Secondary | ICD-10-CM | POA: Diagnosis not present

## 2021-09-19 DIAGNOSIS — H1032 Unspecified acute conjunctivitis, left eye: Secondary | ICD-10-CM | POA: Diagnosis not present

## 2021-09-21 DIAGNOSIS — Z419 Encounter for procedure for purposes other than remedying health state, unspecified: Secondary | ICD-10-CM | POA: Diagnosis not present

## 2021-10-19 DIAGNOSIS — Z419 Encounter for procedure for purposes other than remedying health state, unspecified: Secondary | ICD-10-CM | POA: Diagnosis not present

## 2021-10-28 ENCOUNTER — Ambulatory Visit: Payer: Medicaid Other

## 2021-10-28 ENCOUNTER — Other Ambulatory Visit: Payer: Self-pay | Admitting: Obstetrics and Gynecology

## 2021-10-28 ENCOUNTER — Telehealth: Payer: Self-pay | Admitting: Obstetrics and Gynecology

## 2021-10-28 NOTE — Telephone Encounter (Signed)
Pt called she was having trouble getting her depo filled for apt- she requested to reschedule- moved depo to 3/17- she asked rx to be sent to [facility] on [address]. Also scheduled physical with Dr.Cherry 05/05.  ?

## 2021-10-31 NOTE — Telephone Encounter (Signed)
Rx was sent to pharmacy by Dr. Valentino Saxon on 10/28/2021. ?

## 2021-11-03 NOTE — Progress Notes (Signed)
Date last pap: 09/21/2020. ?Last Depo-Provera: 08/11/21 ?Side Effects if any: None, patient tolerated well. ?Serum HCG indicated? N/A. ?Depo-Provera 150 mg IM given by: Roddie Mc, CMA ?Next appointment due: 01/20/22-02/04/22 ?

## 2021-11-04 ENCOUNTER — Encounter: Payer: Self-pay | Admitting: Obstetrics and Gynecology

## 2021-11-04 ENCOUNTER — Ambulatory Visit (INDEPENDENT_AMBULATORY_CARE_PROVIDER_SITE_OTHER): Payer: Medicaid Other | Admitting: Obstetrics and Gynecology

## 2021-11-04 ENCOUNTER — Other Ambulatory Visit: Payer: Self-pay

## 2021-11-04 DIAGNOSIS — Z3042 Encounter for surveillance of injectable contraceptive: Secondary | ICD-10-CM | POA: Diagnosis not present

## 2021-11-04 MED ORDER — MEDROXYPROGESTERONE ACETATE 150 MG/ML IM SUSP
150.0000 mg | Freq: Once | INTRAMUSCULAR | Status: AC
Start: 2021-11-04 — End: 2021-11-04
  Administered 2021-11-04: 150 mg via INTRAMUSCULAR

## 2021-11-19 DIAGNOSIS — Z419 Encounter for procedure for purposes other than remedying health state, unspecified: Secondary | ICD-10-CM | POA: Diagnosis not present

## 2021-12-19 DIAGNOSIS — Z419 Encounter for procedure for purposes other than remedying health state, unspecified: Secondary | ICD-10-CM | POA: Diagnosis not present

## 2021-12-22 NOTE — Patient Instructions (Addendum)
Care 79-26 Years Old, Female ?Preventive care refers to lifestyle choices and visits with your health care provider that can promote health and wellness. Preventive care visits are also called wellness exams. ?What can I expect for my preventive care visit? ?Counseling ?During your preventive care visit, your health care provider may ask about your: ?Medical history, including: ?Past medical problems. ?Family medical history. ?Pregnancy history. ?Current health, including: ?Menstrual cycle. ?Method of birth control. ?Emotional well-being. ?Home life and relationship well-being. ?Sexual activity and sexual health. ?Lifestyle, including: ?Alcohol, nicotine or tobacco, and drug use. ?Access to firearms. ?Diet, exercise, and sleep habits. ?Work and work Statistician. ?Sunscreen use. ?Safety issues such as seatbelt and bike helmet use. ?Physical exam ?Your health care provider may check your: ?Height and weight. These may be used to calculate your BMI (body mass index). BMI is a measurement that tells if you are at a healthy weight. ?Waist circumference. This measures the distance around your waistline. This measurement also tells if you are at a healthy weight and may help predict your risk of certain diseases, such as type 2 diabetes and high blood pressure. ?Heart rate and blood pressure. ?Body temperature. ?Skin for abnormal spots. ?What immunizations do I need? ? ?Vaccines are usually given at various ages, according to a schedule. Your health care provider will recommend vaccines for you based on your age, medical history, and lifestyle or other factors, such as travel or where you work. ?What tests do I need? ?Screening ?Your health care provider may recommend screening tests for certain conditions. This may include: ?Pelvic exam and Pap test. ?Lipid and cholesterol levels. ?Diabetes screening. This is done by checking your blood sugar (glucose) after you have not eaten for a while (fasting). ?Hepatitis B  test. ?Hepatitis C test. ?HIV (human immunodeficiency virus) test. ?STI (sexually transmitted infection) testing, if you are at risk. ?BRCA-related cancer screening. This may be done if you have a family history of breast, ovarian, tubal, or peritoneal cancers. ?Talk with your health care provider about your test results, treatment options, and if necessary, the need for more tests. ?Follow these instructions at home: ?Eating and drinking ? ?Eat a healthy diet that includes fresh fruits and vegetables, whole grains, lean protein, and low-fat dairy products. ?Take vitamin and mineral supplements as recommended by your health care provider. ?Do not drink alcohol if: ?Your health care provider tells you not to drink. ?You are pregnant, may be pregnant, or are planning to become pregnant. ?If you drink alcohol: ?Limit how much you have to 0-1 drink a day. ?Know how much alcohol is in your drink. In the [place] one drink equals one 12 oz bottle of beer (355 mL), one 5 oz glass of wine (148 mL), or one 1? oz glass of hard liquor (44 mL). ?Lifestyle ?Brush your teeth every morning and night with fluoride toothpaste. Floss one time each day. ?Exercise for at least 30 minutes 5 or more days each week. ?Do not use any products that contain nicotine or tobacco. These products include cigarettes, chewing tobacco, and vaping devices, such as e-cigarettes. If you need help quitting, ask your health care provider. ?Do not use drugs. ?If you are sexually active, practice safe sex. Use a condom or other form of protection to prevent STIs. ?If you do not wish to become pregnant, use a form of birth control. If you plan to become pregnant, see your health care provider for a prepregnancy visit. ?Find healthy ways to manage stress, such as: ?Meditation, yoga,  or listening to music. ?Journaling. ?Talking to a trusted person. ?Spending time with friends and family. ?Minimize exposure to UV radiation to reduce your risk of skin  cancer. ?Safety ?Always wear your seat belt while driving or riding in a vehicle. ?Do not drive: ?If you have been drinking alcohol. Do not ride with someone who has been drinking. ?If you have been using any mind-altering substances or drugs. ?While texting. ?When you are tired or distracted. ?Wear a helmet and other protective equipment during sports activities. ?If you have firearms in your house, make sure you follow all gun safety procedures. ?Seek help if you have been physically or sexually abused. ?What's next? ?Go to your health care provider once a year for an annual wellness visit. ?Ask your health care provider how often you should have your eyes and teeth checked. ?Stay up to date on all vaccines. ?This information is not intended to replace advice given to you by your health care provider. Make sure you discuss any questions you have with your health care provider. ?Document Revised: 02/02/2021 Document Reviewed: 02/02/2021 ?Elsevier Patient Education ? [employer]. ? ?

## 2021-12-22 NOTE — Progress Notes (Signed)
? ? ?GYNECOLOGY ANNUAL PHYSICAL EXAM PROGRESS NOTE ? ?Subjective:  ? ? Sherry Malone is a 26 y.o. G53P2002 female who presents for an annual exam. The patient has no complaints today. The patient is sexually active. The patient participates in regular exercise: no. Has the patient ever been transfused or tattooed?: yes- professional tattoos. The patient reports that there is not domestic violence in her life.  ? ? ? ?Menstrual History: ?Menarche age: 65 ?No LMP recorded. Patient has had an injection. ?  ? ?Gynecologic History:  ?Contraception: Depo-Provera injections ?History of STI's: Denies ?Last Pap: 09/21/2020. Results were: abnormal. Notes h/o abnormal pap smears. ? ? ? Upstream - 12/23/21 1129   ? ?  ? Pregnancy Intention Screening  ? Does the patient want to become pregnant in the next year? Ok Either Way   ? Does the patient's partner want to become pregnant in the next year? Ok Either Way   ? Would the patient like to discuss contraceptive options today? No   ?  ? Contraception Wrap Up  ? Current Method Hormonal Injection   ? End Method Hormonal Injection   ? Contraception Counseling Provided No   ? ?  ?  ? ?  ? ?The pregnancy intention screening data noted above was reviewed. Potential methods of contraception were discussed. The patient elected to proceed with Hormonal Injection. ? ? ?OB History  ?Gravida Para Term Preterm AB Living  ?2 2 2  0 0 2  ?SAB IAB Ectopic Multiple Live Births  ?0 0 0 0 2  ?  ?# Outcome Date GA Lbr Len/2nd Weight Sex Delivery Anes PTL Lv  ?2 Term 07/04/17 [redacted]w[redacted]d / 00:24 7 lb 0.9 oz (3.2 kg) F Vag-Spont None  LIV  ?   Name: [redacted]w[redacted]d  ?   Apgar1: 8  Apgar5: 9  ?1 Term 10/26/15 [redacted]w[redacted]d 20:15 / 00:26 6 lb 4.2 oz (2.84 kg) F Vag-Forceps EPI  LIV  ?   Name: [redacted]w[redacted]d  ?   Apgar1: 10  Apgar5: 9  ? ? ?Past Medical History:  ?Diagnosis Date  ? Asthma   ? History of trichomoniasis 01/16/2017  ? ? ?Past Surgical History:  ?Procedure Laterality Date  ? CHOLECYSTECTOMY N/A 05/15/2016  ?  Procedure: LAPAROSCOPIC CHOLECYSTECTOMY;  Surgeon: 05/17/2016, MD;  Location: [facility] ORS;  Service: General;  Laterality: N/A;  ? NO PAST SURGERIES    ? ? ?Family History  ?Problem Relation Age of Onset  ? Vision loss Mother   ? Hearing loss Mother   ? Hypertension Father   ? Diabetes Maternal Grandmother   ? Kidney disease Maternal Grandmother   ? Kidney failure Maternal Grandmother   ? Gallstones Other   ? Breast cancer Neg Hx   ? Ovarian cancer Neg Hx   ? Colon cancer Neg Hx   ? ? ?Social History  ? ?Socioeconomic History  ? Marital status: Single  ?  Spouse name: Not on file  ? Number of children: Not on file  ? Years of education: Not on file  ? Highest education level: Not on file  ?Occupational History  ? Not on file  ?Tobacco Use  ? Smoking status: Some Days  ?  Packs/day: 0.25  ?  Types: Cigarettes  ?  Last attempt to quit: 04/30/2015  ?  Years since quitting: 6.6  ? Smokeless tobacco: Never  ?Vaping Use  ? Vaping Use: Some days  ?Substance and Sexual Activity  ? Alcohol use: Yes  ?  Comment: rarely  ? Drug use: Not Currently  ?  Frequency: 9.0 times per week  ?  Types: Marijuana  ? Sexual activity: Yes  ?  Birth control/protection: Injection  ?Other Topics Concern  ? Not on file  ?Social History Narrative  ? Not on file  ? ?Social Determinants of Health  ? ?Financial Resource Strain: Not on file  ?Food Insecurity: Not on file  ?Transportation Needs: Not on file  ?Physical Activity: Not on file  ?Stress: Not on file  ?Social Connections: Not on file  ?Intimate Partner Violence: Not on file  ? ? ?Current Outpatient Medications on File Prior to Visit  ?Medication Sig Dispense Refill  ? acetaminophen (TYLENOL) 500 MG tablet Take 1,000 mg by mouth every 6 (six) hours as needed.    ? albuterol (VENTOLIN HFA) 108 (90 Base) MCG/ACT inhaler Inhale 1-2 puffs into the lungs every 4 (four) hours as needed for wheezing or shortness of breath. 8 g 1  ? ipratropium (ATROVENT) 0.06 % nasal spray Place 2 sprays into both  nostrils 4 (four) times daily as needed for rhinitis. 15 mL 12  ? medroxyPROGESTERone (DEPO-PROVERA) 150 MG/ML injection INJECT 1 ML (150 MG TOTAL) INTO THE MUSCLE EVERY 3 (THREE) MONTHS 1 mL 4  ? Multiple Vitamins-Minerals (ONE-A-DAY WOMENS PO) Take by mouth.    ? ondansetron (ZOFRAN) 4 MG tablet Take 1 tablet (4 mg total) by mouth every 8 (eight) hours as needed for nausea or vomiting. 20 tablet 0  ? ?Current Facility-Administered Medications on File Prior to Visit  ?Medication Dose Route Frequency Provider Last Rate Last Admin  ? medroxyPROGESTERone (DEPO-PROVERA) injection 150 mg  150 mg Intramuscular Q90 days Hildred Laserherry, Desirae Mancusi, MD   150 mg at 08/11/21 [id number]  ? ? ?No Known Allergies ? ? ?Review of Systems ?Constitutional: negative for chills, fatigue, fevers and sweats ?Eyes: negative for irritation, redness and visual disturbance ?Ears, nose, mouth, throat, and face: negative for hearing loss, nasal congestion, snoring and tinnitus ?Respiratory: negative for asthma, cough, sputum ?Cardiovascular: negative for chest pain, dyspnea, exertional chest pressure/discomfort, irregular heart beat, palpitations and syncope ?Gastrointestinal: negative for abdominal pain, change in bowel habits, nausea and vomiting ?Genitourinary: negative for abnormal menstrual periods, genital lesions, sexual problems and vaginal discharge, dysuria and urinary incontinence ?Integument/breast: negative for breast lump, breast tenderness and nipple discharge ?Hematologic/lymphatic: negative for bleeding and easy bruising ?Musculoskeletal:negative for back pain and muscle weakness ?Neurological: negative for dizziness, headaches, vertigo and weakness ?Endocrine: negative for diabetic symptoms including polydipsia, polyuria and skin dryness ?Allergic/Immunologic: negative for hay fever and urticaria    ? ? ?Objective:  ?Blood pressure 116/64, pulse 78, resp. rate 16, height 5\' 2"  (1.575 m), weight 167 lb 9.6 oz (76 kg).  Body mass index is 30.65  kg/m?. ?  ?General Appearance:    Alert, cooperative, no distress, appears stated age, mild obesity  ?Head:    Normocephalic, without obvious abnormality, atraumatic  ?Eyes:    PERRL, conjunctiva/corneas clear, EOM's intact, both eyes  ?Ears:    Normal external ear canals, both ears  ?Nose:   Nares normal, septum midline, mucosa normal, no drainage or sinus tenderness  ?Throat:   Lips, mucosa, and tongue normal; teeth and gums normal  ?Neck:   Supple, symmetrical, trachea midline, no adenopathy; thyroid: no enlargement/tenderness/nodules; no carotid bruit or JVD  ?Back:     Symmetric, no curvature, ROM normal, no CVA tenderness  ?Lungs:     Clear to auscultation bilaterally, respirations unlabored  ?Chest Wall:  No tenderness or deformity  ? Heart:    Regular rate and rhythm, S1 and S2 normal, no murmur, rub or gallop  ?Breast Exam:    No tenderness, masses, or nipple abnormality  ?Abdomen:     Soft, non-tender, bowel sounds active all four quadrants, no masses, no organomegaly.    ?Genitalia:    Pelvic:external genitalia normal, vagina without lesions, discharge, or tenderness, rectovaginal septum  normal. Cervix normal in appearance, no cervical motion tenderness, no adnexal masses or tenderness.  Uterus normal size, shape, mobile, regular contours, nontender.  ?Rectal:    Normal external sphincter.  No hemorrhoids appreciated. Internal exam not done.   ?Extremities:   Extremities normal, atraumatic, no cyanosis or edema  ?Pulses:   2+ and symmetric all extremities  ?Skin:   Skin color, texture, turgor normal, no rashes or lesions  ?Lymph nodes:   Cervical, supraclavicular, and axillary nodes normal  ?Neurologic:   CNII-XII intact, normal strength, sensation and reflexes throughout  ? ?. ? ?Labs:  ?Lab Results  ?Component Value Date  ? WBC 6.5 09/21/2020  ? HGB 13.8 09/21/2020  ? HCT 41.2 09/21/2020  ? MCV 84 09/21/2020  ? PLT 242 09/21/2020  ? ? ?Lab Results  ?Component Value Date  ? CREATININE 0.70 09/21/2020   ? BUN 8 09/21/2020  ? NA 142 09/21/2020  ? K 4.2 09/21/2020  ? CL 106 09/21/2020  ? CO2 22 09/21/2020  ? ? ?Lab Results  ?Component Value Date  ? ALT 12 09/21/2020  ? AST 17 09/21/2020  ? ALKPHOS 48 02/01/

## 2021-12-23 ENCOUNTER — Encounter: Payer: Self-pay | Admitting: Obstetrics and Gynecology

## 2021-12-23 ENCOUNTER — Ambulatory Visit (INDEPENDENT_AMBULATORY_CARE_PROVIDER_SITE_OTHER): Payer: Medicaid Other | Admitting: Obstetrics and Gynecology

## 2021-12-23 VITALS — BP 116/64 | HR 78 | Resp 16 | Ht 62.0 in | Wt 167.6 lb

## 2021-12-23 DIAGNOSIS — Z131 Encounter for screening for diabetes mellitus: Secondary | ICD-10-CM

## 2021-12-23 DIAGNOSIS — Z0000 Encounter for general adult medical examination without abnormal findings: Secondary | ICD-10-CM

## 2021-12-23 DIAGNOSIS — E669 Obesity, unspecified: Secondary | ICD-10-CM | POA: Diagnosis not present

## 2021-12-23 DIAGNOSIS — E66811 Obesity, class 1: Secondary | ICD-10-CM

## 2021-12-23 DIAGNOSIS — Z01419 Encounter for gynecological examination (general) (routine) without abnormal findings: Secondary | ICD-10-CM | POA: Diagnosis not present

## 2021-12-23 DIAGNOSIS — Z3042 Encounter for surveillance of injectable contraceptive: Secondary | ICD-10-CM | POA: Diagnosis not present

## 2021-12-24 LAB — CBC
Hematocrit: 39 % (ref 34.0–46.6)
Hemoglobin: 13.3 g/dL (ref 11.1–15.9)
MCH: 28.3 pg (ref 26.6–33.0)
MCHC: 34.1 g/dL (ref 31.5–35.7)
MCV: 83 fL (ref 79–97)
Platelets: 274 10*3/uL (ref 150–450)
RBC: 4.7 x10E6/uL (ref 3.77–5.28)
RDW: 12.7 % (ref 11.7–15.4)
WBC: 6.9 10*3/uL (ref 3.4–10.8)

## 2021-12-24 LAB — COMPREHENSIVE METABOLIC PANEL
ALT: 22 IU/L (ref 0–32)
AST: 16 IU/L (ref 0–40)
Albumin/Globulin Ratio: 1.7 (ref 1.2–2.2)
Albumin: 4.5 g/dL (ref 3.9–5.0)
Alkaline Phosphatase: 49 IU/L (ref 44–121)
BUN/Creatinine Ratio: 13 (ref 9–23)
BUN: 10 mg/dL (ref 6–20)
Bilirubin Total: 0.3 mg/dL (ref 0.0–1.2)
CO2: 19 mmol/L — ABNORMAL LOW (ref 20–29)
Calcium: 9 mg/dL (ref 8.7–10.2)
Chloride: 107 mmol/L — ABNORMAL HIGH (ref 96–106)
Creatinine, Ser: 0.79 mg/dL (ref 0.57–1.00)
Globulin, Total: 2.6 g/dL (ref 1.5–4.5)
Glucose: 92 mg/dL (ref 70–99)
Potassium: 4.1 mmol/L (ref 3.5–5.2)
Sodium: 143 mmol/L (ref 134–144)
Total Protein: 7.1 g/dL (ref 6.0–8.5)
eGFR: 106 mL/min/{1.73_m2} (ref >59)

## 2021-12-24 LAB — HEMOGLOBIN A1C
Est. average glucose Bld gHb Est-mCnc: 103 mg/dL
Hgb A1c MFr Bld: 5.2 % (ref 4.8–5.6)

## 2022-01-19 DIAGNOSIS — Z419 Encounter for procedure for purposes other than remedying health state, unspecified: Secondary | ICD-10-CM | POA: Diagnosis not present

## 2022-01-20 ENCOUNTER — Ambulatory Visit (INDEPENDENT_AMBULATORY_CARE_PROVIDER_SITE_OTHER): Payer: Medicaid Other | Admitting: Obstetrics and Gynecology

## 2022-01-20 DIAGNOSIS — Z3042 Encounter for surveillance of injectable contraceptive: Secondary | ICD-10-CM | POA: Diagnosis not present

## 2022-01-20 MED ORDER — MEDROXYPROGESTERONE ACETATE 150 MG/ML IM SUSP
150.0000 mg | Freq: Once | INTRAMUSCULAR | Status: AC
  Administered 2022-01-20: 150 mg via INTRAMUSCULAR

## 2022-01-20 NOTE — Progress Notes (Signed)
Date last pap: 09/21/2020. Last Depo-Provera: 11/04/21 Side Effects if any: None, patient tolerated well. Serum HCG indicated? N/A. Depo-Provera 150 mg IM given by: Georgiana Shore, CMA Next appointment due: 04/07/22-04/21/22

## 2022-02-18 DIAGNOSIS — Z419 Encounter for procedure for purposes other than remedying health state, unspecified: Secondary | ICD-10-CM | POA: Diagnosis not present

## 2022-03-21 DIAGNOSIS — Z419 Encounter for procedure for purposes other than remedying health state, unspecified: Secondary | ICD-10-CM | POA: Diagnosis not present

## 2022-04-06 NOTE — Progress Notes (Signed)
Side Effects if any: NONE. Depo-Provera 150 mg IM given by: Roddie Mc, CMA. Pt presents for routine injection of depo provera contraception. Pt tolerated injection well. No concerns at this time. All questions answered.  RETURN NOV. 3 - NOV. 17 FOR NEXT DEPO INJECTION.

## 2022-04-07 ENCOUNTER — Encounter: Payer: Self-pay | Admitting: Obstetrics and Gynecology

## 2022-04-07 ENCOUNTER — Ambulatory Visit (INDEPENDENT_AMBULATORY_CARE_PROVIDER_SITE_OTHER): Payer: Medicaid Other | Admitting: Obstetrics and Gynecology

## 2022-04-07 DIAGNOSIS — Z3042 Encounter for surveillance of injectable contraceptive: Secondary | ICD-10-CM

## 2022-04-07 MED ORDER — MEDROXYPROGESTERONE ACETATE 150 MG/ML IM SUSP: 150.0000 mg | Freq: Once | INTRAMUSCULAR | Status: AC

## 2022-04-21 DIAGNOSIS — Z419 Encounter for procedure for purposes other than remedying health state, unspecified: Secondary | ICD-10-CM | POA: Diagnosis not present

## 2022-05-21 DIAGNOSIS — Z419 Encounter for procedure for purposes other than remedying health state, unspecified: Secondary | ICD-10-CM | POA: Diagnosis not present

## 2022-06-21 DIAGNOSIS — Z419 Encounter for procedure for purposes other than remedying health state, unspecified: Secondary | ICD-10-CM | POA: Diagnosis not present

## 2022-06-23 ENCOUNTER — Ambulatory Visit: Payer: Medicaid Other

## 2022-06-23 NOTE — Progress Notes (Deleted)
Date last pap: 09/21/2020. Last Depo-Provera: 04/07/2022. Side Effects if any: None reported. Serum HCG indicated? N/A. Depo-Provera 150 mg IM given by: Nunzio Cory. W, NCMA. Next appointment due 1/19/-09/22/2022.

## 2022-07-21 ENCOUNTER — Encounter: Payer: Self-pay | Admitting: Nurse Practitioner

## 2022-07-21 ENCOUNTER — Ambulatory Visit (INDEPENDENT_AMBULATORY_CARE_PROVIDER_SITE_OTHER): Payer: Medicaid Other | Admitting: Nurse Practitioner

## 2022-07-21 VITALS — BP 93/70 | HR 67 | Temp 97.7°F | Ht 62.0 in | Wt 182.5 lb

## 2022-07-21 DIAGNOSIS — Z6833 Body mass index (BMI) 33.0-33.9, adult: Secondary | ICD-10-CM

## 2022-07-21 DIAGNOSIS — K649 Unspecified hemorrhoids: Secondary | ICD-10-CM | POA: Diagnosis not present

## 2022-07-21 DIAGNOSIS — E669 Obesity, unspecified: Secondary | ICD-10-CM | POA: Diagnosis not present

## 2022-07-21 DIAGNOSIS — F1290 Cannabis use, unspecified, uncomplicated: Secondary | ICD-10-CM

## 2022-07-21 DIAGNOSIS — Z419 Encounter for procedure for purposes other than remedying health state, unspecified: Secondary | ICD-10-CM | POA: Diagnosis not present

## 2022-07-21 DIAGNOSIS — Z716 Tobacco abuse counseling: Secondary | ICD-10-CM

## 2022-07-21 NOTE — Progress Notes (Unsigned)
New Patient Office Visit  Subjective    Patient ID: Sherry Malone, female    DOB: 1995/09/22  Age: 26 y.o. MRN: [id number]  CC:  Chief Complaint  Patient presents with   Establish Care    Thinks she may have hemorrhoids.    HPI Sherry Malone presents to establish care. She work at [employer]. Her previous PCP was Ms. Kyung Rudd, NP at [facility]. She has not been to PCP since 2020. She was getting the preventive care from [facility]. She has h/o asthma and use albuterol inhaler as needed.   She complaint of hemorrhoids the symptoms stared about a month ago. She felt something was poking out of the anal area. She denies  blood in the stool. She has no symptoms at present.  She gets off and on constipation. She has BM once a day but sometimes have to strain. She has no other complaint at present.       Outpatient Encounter Medications as of 07/21/2022  Medication Sig   albuterol (VENTOLIN HFA) 108 (90 Base) MCG/ACT inhaler Inhale 1-2 puffs into the lungs every 4 (four) hours as needed for wheezing or shortness of breath.   ipratropium (ATROVENT) 0.06 % nasal spray Place 2 sprays into both nostrils 4 (four) times daily as needed for rhinitis.   medroxyPROGESTERone (DEPO-PROVERA) 150 MG/ML injection INJECT 1 ML (150 MG TOTAL) INTO THE MUSCLE EVERY 3 (THREE) MONTHS   [DISCONTINUED] acetaminophen (TYLENOL) 500 MG tablet Take 1,000 mg by mouth every 6 (six) hours as needed.   [DISCONTINUED] Multiple Vitamins-Minerals (ONE-A-DAY WOMENS PO) Take by mouth.   [DISCONTINUED] ondansetron (ZOFRAN) 4 MG tablet Take 1 tablet (4 mg total) by mouth every 8 (eight) hours as needed for nausea or vomiting.   Facility-Administered Encounter Medications as of 07/21/2022  Medication   medroxyPROGESTERone (DEPO-PROVERA) injection 150 mg   medroxyPROGESTERone (DEPO-PROVERA) injection 150 mg    Past Medical History:  Diagnosis Date   Asthma    History of trichomoniasis  01/16/2017    Past Surgical History:  Procedure Laterality Date   CHOLECYSTECTOMY N/A 05/15/2016   Procedure: LAPAROSCOPIC CHOLECYSTECTOMY;  Surgeon: Leafy Ro, MD;  Location: [facility] ORS;  Service: General;  Laterality: N/A;   NO PAST SURGERIES      Family History  Problem Relation Age of Onset   Vision loss Mother    Hearing loss Mother    Hypertension Father    Diabetes Maternal Grandmother    Kidney disease Maternal Grandmother    Kidney failure Maternal Grandmother    Gallstones Other    Breast cancer Neg Hx    Ovarian cancer Neg Hx    Colon cancer Neg Hx     Social History   Socioeconomic History   Marital status: Single    Spouse name: Not on file   Number of children: Not on file   Years of education: Not on file   Highest education level: Not on file  Occupational History   Not on file  Tobacco Use   Smoking status: Some Days    Types: Cigarettes   Smokeless tobacco: Never  Vaping Use   Vaping Use: Former  Substance and Sexual Activity   Alcohol use: Yes    Comment: rarely   Drug use: Yes    Frequency: 2.0 times per week    Types: Marijuana    Comment: Mostly weekends   Sexual activity: Yes    Birth control/protection: Injection  Other Topics Concern  Not on file  Social History Narrative   Not on file   Social Determinants of Health   Financial Resource Strain: Not on file  Food Insecurity: Not on file  Transportation Needs: Not on file  Physical Activity: Not on file  Stress: Not on file  Social Connections: Not on file  Intimate Partner Violence: Not on file    Review of Systems  Constitutional: Negative.   HENT: Negative.    Eyes: Negative.   Respiratory:  Negative for cough and shortness of breath.   Cardiovascular:  Negative for chest pain and leg swelling.  Gastrointestinal: Negative.   Genitourinary: Negative.   Musculoskeletal:  Positive for back pain.  Neurological: Negative.   Psychiatric/Behavioral:  Negative for  depression, substance abuse and suicidal ideas.         Objective    BP 93/70   Pulse 67   Temp 97.7 F (36.5 C) (Temporal)   Ht 5\' 2"  (1.575 m)   Wt 182 lb 8 oz (82.8 kg)   SpO2 99%   BMI 33.38 kg/m   Physical Exam Constitutional:      Appearance: Normal appearance. She is obese.  HENT:     Right Ear: Tympanic membrane normal.     Left Ear: Tympanic membrane normal.     Mouth/Throat:     Mouth: Mucous membranes are moist.     Pharynx: Oropharynx is clear.  Eyes:     Conjunctiva/sclera: Conjunctivae normal.     Pupils: Pupils are equal, round, and reactive to light.  Cardiovascular:     Rate and Rhythm: Normal rate and regular rhythm.     Pulses: Normal pulses.  Pulmonary:     Effort: Pulmonary effort is normal.     Breath sounds: No stridor. No wheezing.  Abdominal:     General: Bowel sounds are normal. There is distension.     Palpations: Abdomen is soft.     Hernia: No hernia is present.  Musculoskeletal:        General: Normal range of motion.     Cervical back: Normal range of motion.  Skin:    General: Skin is warm.  Neurological:     General: No focal deficit present.     Mental Status: She is alert and oriented to person, place, and time. Mental status is at baseline.  Psychiatric:        Mood and Affect: Mood normal.        Behavior: Behavior normal.        Thought Content: Thought content normal.        Judgment: Judgment normal.         Assessment & Plan:   Problem List Items Addressed This Visit       Cardiovascular and Mediastinum   Hemorrhoids    Advised her to consume diet rich in fiber and increase fluid intake If needed would refer to GI        Other   Marijuana use    She smokes marijuana during the weekend. Education provided  for marijuana cessation.      Class 1 obesity with serious comorbidity and body mass index (BMI) of 33.0 to 33.9 in adult - Primary    Body mass index is 33.38 kg/m. Advised pt to lose  weight. Advised patient to avoid trans fat, fatty and fried food. Follow a regular physical activity schedule. Went over the risk of chronic diseases with increased weight.  Relevant Orders   Lipid panel (Completed)   Tobacco abuse counseling    Smoking cessation was discussed, 5-7 minutes was spent of this topic specifically.          No follow-ups on file.   Kara Dies, NP

## 2022-07-22 LAB — LIPID PANEL
Cholesterol: 166 mg/dL (ref <200)
HDL: 53 mg/dL (ref >=50)
LDL Cholesterol (Calc): 96 mg/dL (calc)
Non-HDL Cholesterol (Calc): 113 mg/dL (calc) (ref <130)
Total CHOL/HDL Ratio: 3.1 (calc) (ref <5.0)
Triglycerides: 76 mg/dL (ref <150)

## 2022-07-27 ENCOUNTER — Encounter: Payer: Self-pay | Admitting: Nurse Practitioner

## 2022-07-27 DIAGNOSIS — K649 Unspecified hemorrhoids: Secondary | ICD-10-CM | POA: Insufficient documentation

## 2022-07-27 DIAGNOSIS — E669 Obesity, unspecified: Secondary | ICD-10-CM | POA: Insufficient documentation

## 2022-07-27 DIAGNOSIS — Z716 Tobacco abuse counseling: Secondary | ICD-10-CM | POA: Insufficient documentation

## 2022-07-27 NOTE — Assessment & Plan Note (Signed)
Smoking cessation was discussed, 5-7 minutes was spent of this topic specifically.   

## 2022-07-27 NOTE — Assessment & Plan Note (Signed)
Advised her to consume diet rich in fiber and increase fluid intake If needed would refer to GI

## 2022-07-27 NOTE — Assessment & Plan Note (Signed)
She smokes marijuana during the weekend. Education provided  for marijuana cessation.

## 2022-07-27 NOTE — Assessment & Plan Note (Signed)
Body mass index is 33.38 kg/m. Advised pt to lose weight. Advised patient to avoid trans fat, fatty and fried food. Follow a regular physical activity schedule. Went over the risk of chronic diseases with increased weight.

## 2022-08-21 DIAGNOSIS — Z419 Encounter for procedure for purposes other than remedying health state, unspecified: Secondary | ICD-10-CM | POA: Diagnosis not present

## 2022-08-24 NOTE — Progress Notes (Deleted)
    NURSE VISIT NOTE  Subjective:    Patient ID: Fredericka Bottcher Alonso-Contreras, female    DOB: 02-24-1996, 27 y.o.   MRN: [id number]  HPI  Patient is a 27 y.o. G60P2002 female who presents for surveillance of depo provera injection.  Date last pap: 09/21/2020. Last Depo-Provera: 04/07/2022. Side Effects if any: ***. Serum HCG indicated? ***. Depo-Provera 150 mg IM given by: Cristy Folks, CMA. Next appointment due: March 23 - April 6.   The following portions of the patient's history were reviewed and updated as appropriate: allergies, current medications, past family history, past medical history, past social history, past surgical history, and problem list.  Review of Systems Pertinent items are noted in HPI.   Objective:   There were no vitals taken for this visit. There is no height or weight on file to calculate BMI.  General appearance: alert, cooperative, and no distress    Assessment:   1. Encounter for management and injection of depo-Provera      Plan:   - Follow up in 3 months for next depo provera injection.  (March 23 - April 6)    Cristy Folks, [place]

## 2022-08-25 DIAGNOSIS — Z3042 Encounter for surveillance of injectable contraceptive: Secondary | ICD-10-CM

## 2022-08-31 NOTE — Progress Notes (Signed)
This encounter was created in error - please disregard.

## 2022-09-21 DIAGNOSIS — Z419 Encounter for procedure for purposes other than remedying health state, unspecified: Secondary | ICD-10-CM | POA: Diagnosis not present

## 2022-10-20 DIAGNOSIS — Z419 Encounter for procedure for purposes other than remedying health state, unspecified: Secondary | ICD-10-CM | POA: Diagnosis not present

## 2022-11-20 DIAGNOSIS — Z419 Encounter for procedure for purposes other than remedying health state, unspecified: Secondary | ICD-10-CM | POA: Diagnosis not present

## 2022-11-23 DIAGNOSIS — H5213 Myopia, bilateral: Secondary | ICD-10-CM | POA: Diagnosis not present

## 2022-11-23 DIAGNOSIS — H52223 Regular astigmatism, bilateral: Secondary | ICD-10-CM | POA: Diagnosis not present

## 2022-12-20 DIAGNOSIS — Z419 Encounter for procedure for purposes other than remedying health state, unspecified: Secondary | ICD-10-CM | POA: Diagnosis not present

## 2022-12-26 ENCOUNTER — Encounter: Payer: Self-pay | Admitting: Obstetrics and Gynecology

## 2022-12-26 NOTE — Progress Notes (Deleted)
GYNECOLOGY ANNUAL PHYSICAL EXAM PROGRESS NOTE  Subjective:    Sherry Malone is a 27 y.o. G40P2002 female who presents for an annual exam. The patient has no complaints today. The patient {is/is not/has never been:13135} sexually active. The patient participates in regular exercise: {yes/no/not asked:9010}. Has the patient ever been transfused or tattooed?: {yes/no/not asked:9010}. The patient reports that there {is/is not:9024} domestic violence in her life.    Menstrual History: Menarche age: 42 No LMP recorded. Patient has had an injection.     Gynecologic History:  Contraception: Depo-Provera injections History of STI's: Denies Last Pap: 09/21/2020. Results were: abnormal. Notes h/o abnormal pap smears.  Last mammogram: Not age appropriate    OB History  Gravida Para Term Preterm AB Living  2 2 2  0 0 2  SAB IAB Ectopic Multiple Live Births  0 0 0 0 2    # Outcome Date GA Lbr Len/2nd Weight Sex Delivery Anes PTL Lv  2 Term 07/04/17 [redacted]w[redacted]d / 00:24 7 lb 0.9 oz (3.2 kg) F Vag-Spont None  LIV     Name: Marvis Moeller     Apgar1: 8  Apgar5: 9  1 Term 10/26/15 [redacted]w[redacted]d 20:15 / 00:26 6 lb 4.2 oz (2.84 kg) F Vag-Forceps EPI  LIV     Name: [name]     Apgar1: 10  Apgar5: 9    Past Medical History:  Diagnosis Date   Asthma    History of trichomoniasis 01/16/2017    Past Surgical History:  Procedure Laterality Date   CHOLECYSTECTOMY N/A 05/15/2016   Procedure: LAPAROSCOPIC CHOLECYSTECTOMY;  Surgeon: Leafy Ro, MD;  Location: [facility] ORS;  Service: General;  Laterality: N/A;   NO PAST SURGERIES      Family History  Problem Relation Age of Onset   Vision loss Mother    Hearing loss Mother    Hypertension Father    Diabetes Maternal Grandmother    Kidney disease Maternal Grandmother    Kidney failure Maternal Grandmother    Gallstones Other    Breast cancer Neg Hx    Ovarian cancer Neg Hx    Colon cancer Neg Hx     Social History   Socioeconomic History    Marital status: Single    Spouse name: Not on file   Number of children: Not on file   Years of education: Not on file   Highest education level: Not on file  Occupational History   Not on file  Tobacco Use   Smoking status: Some Days    Types: Cigarettes   Smokeless tobacco: Never  Vaping Use   Vaping Use: Former  Substance and Sexual Activity   Alcohol use: Yes    Comment: rarely   Drug use: Yes    Frequency: 2.0 times per week    Types: Marijuana    Comment: Mostly weekends   Sexual activity: Yes    Birth control/protection: Injection  Other Topics Concern   Not on file  Social History Narrative   Not on file   Social Determinants of Health   Financial Resource Strain: Not on file  Food Insecurity: Not on file  Transportation Needs: Not on file  Physical Activity: Not on file  Stress: Not on file  Social Connections: Not on file  Intimate Partner Violence: Not on file    Current Outpatient Medications on File Prior to Visit  Medication Sig Dispense Refill   albuterol (VENTOLIN HFA) 108 (90 Base) MCG/ACT inhaler Inhale 1-2 puffs into the  lungs every 4 (four) hours as needed for wheezing or shortness of breath. 8 g 1   ipratropium (ATROVENT) 0.06 % nasal spray Place 2 sprays into both nostrils 4 (four) times daily as needed for rhinitis. 15 mL 12   medroxyPROGESTERone (DEPO-PROVERA) 150 MG/ML injection INJECT 1 ML (150 MG TOTAL) INTO THE MUSCLE EVERY 3 (THREE) MONTHS 1 mL 4   Current Facility-Administered Medications on File Prior to Visit  Medication Dose Route Frequency Provider Last Rate Last Admin   medroxyPROGESTERone (DEPO-PROVERA) injection 150 mg  150 mg Intramuscular Q90 days Hildred Laser, MD   150 mg at 04/07/22 0941   medroxyPROGESTERone (DEPO-PROVERA) injection 150 mg  150 mg Intramuscular Once Hildred Laser, MD        No Known Allergies   Review of Systems Constitutional: negative for chills, fatigue, fevers and sweats Eyes: negative for  irritation, redness and visual disturbance Ears, nose, mouth, throat, and face: negative for hearing loss, nasal congestion, snoring and tinnitus Respiratory: negative for asthma, cough, sputum Cardiovascular: negative for chest pain, dyspnea, exertional chest pressure/discomfort, irregular heart beat, palpitations and syncope Gastrointestinal: negative for abdominal pain, change in bowel habits, nausea and vomiting Genitourinary: negative for abnormal menstrual periods, genital lesions, sexual problems and vaginal discharge, dysuria and urinary incontinence Integument/breast: negative for breast lump, breast tenderness and nipple discharge Hematologic/lymphatic: negative for bleeding and easy bruising Musculoskeletal:negative for back pain and muscle weakness Neurological: negative for dizziness, headaches, vertigo and weakness Endocrine: negative for diabetic symptoms including polydipsia, polyuria and skin dryness Allergic/Immunologic: negative for hay fever and urticaria      Objective:  There were no vitals taken for this visit. There is no height or weight on file to calculate BMI.    General Appearance:    Alert, cooperative, no distress, appears stated age  Head:    Normocephalic, without obvious abnormality, atraumatic  Eyes:    PERRL, conjunctiva/corneas clear, EOM's intact, both eyes  Ears:    Normal external ear canals, both ears  Nose:   Nares normal, septum midline, mucosa normal, no drainage or sinus tenderness  Throat:   Lips, mucosa, and tongue normal; teeth and gums normal  Neck:   Supple, symmetrical, trachea midline, no adenopathy; thyroid: no enlargement/tenderness/nodules; no carotid bruit or JVD  Back:     Symmetric, no curvature, ROM normal, no CVA tenderness  Lungs:     Clear to auscultation bilaterally, respirations unlabored  Chest Wall:    No tenderness or deformity   Heart:    Regular rate and rhythm, S1 and S2 normal, no murmur, rub or gallop  Breast Exam:     No tenderness, masses, or nipple abnormality  Abdomen:     Soft, non-tender, bowel sounds active all four quadrants, no masses, no organomegaly.    Genitalia:    Pelvic:external genitalia normal, vagina without lesions, discharge, or tenderness, rectovaginal septum  normal. Cervix normal in appearance, no cervical motion tenderness, no adnexal masses or tenderness.  Uterus normal size, shape, mobile, regular contours, nontender.  Rectal:    Normal external sphincter.  No hemorrhoids appreciated. Internal exam not done.   Extremities:   Extremities normal, atraumatic, no cyanosis or edema  Pulses:   2+ and symmetric all extremities  Skin:   Skin color, texture, turgor normal, no rashes or lesions  Lymph nodes:   Cervical, supraclavicular, and axillary nodes normal  Neurologic:   CNII-XII intact, normal strength, sensation and reflexes throughout   .  Labs:  Lab Results  Component  Value Date   WBC 6.9 12/23/2021   HGB 13.3 12/23/2021   HCT 39.0 12/23/2021   MCV 83 12/23/2021   PLT 274 12/23/2021    Lab Results  Component Value Date   CREATININE 0.79 12/23/2021   BUN 10 12/23/2021   NA 143 12/23/2021   K 4.1 12/23/2021   CL 107 (H) 12/23/2021   CO2 19 (L) 12/23/2021    Lab Results  Component Value Date   ALT 22 12/23/2021   AST 16 12/23/2021   ALKPHOS 49 12/23/2021   BILITOT 0.3 12/23/2021    Lab Results  Component Value Date   TSH 0.628 09/21/2020     Assessment:   1. Encounter for well woman exam with routine gynecological exam   2. Screening for diabetes mellitus   3. Obesity (BMI 30.0-34.9)      Plan:  Blood tests: Ordered today. Breast self exam technique reviewed and patient encouraged to perform self-exam monthly. Contraception: Depo-Provera injections. Discussed healthy lifestyle modifications. Mammogram  : Not age appropriate Pap smear  UTD . COVID vaccination status: Follow up in 1 year for annual exam   Hildred Laser, MD [facility]  [facility]

## 2022-12-27 ENCOUNTER — Ambulatory Visit: Payer: Medicaid Other | Admitting: Obstetrics and Gynecology

## 2022-12-27 DIAGNOSIS — E669 Obesity, unspecified: Secondary | ICD-10-CM

## 2022-12-27 DIAGNOSIS — Z131 Encounter for screening for diabetes mellitus: Secondary | ICD-10-CM

## 2022-12-27 DIAGNOSIS — Z01419 Encounter for gynecological examination (general) (routine) without abnormal findings: Secondary | ICD-10-CM

## 2023-01-17 DIAGNOSIS — O219 Vomiting of pregnancy, unspecified: Secondary | ICD-10-CM | POA: Diagnosis not present

## 2023-01-17 DIAGNOSIS — N912 Amenorrhea, unspecified: Secondary | ICD-10-CM | POA: Diagnosis not present

## 2023-01-17 DIAGNOSIS — Z01419 Encounter for gynecological examination (general) (routine) without abnormal findings: Secondary | ICD-10-CM | POA: Diagnosis not present

## 2023-01-17 DIAGNOSIS — Z0000 Encounter for general adult medical examination without abnormal findings: Secondary | ICD-10-CM | POA: Diagnosis not present

## 2023-01-17 DIAGNOSIS — Z113 Encounter for screening for infections with a predominantly sexual mode of transmission: Secondary | ICD-10-CM | POA: Diagnosis not present

## 2023-01-17 DIAGNOSIS — Z202 Contact with and (suspected) exposure to infections with a predominantly sexual mode of transmission: Secondary | ICD-10-CM | POA: Diagnosis not present

## 2023-01-20 DIAGNOSIS — Z419 Encounter for procedure for purposes other than remedying health state, unspecified: Secondary | ICD-10-CM | POA: Diagnosis not present

## 2023-02-01 DIAGNOSIS — Z369 Encounter for antenatal screening, unspecified: Secondary | ICD-10-CM | POA: Diagnosis not present

## 2023-02-01 DIAGNOSIS — Z31430 Encounter of female for testing for genetic disease carrier status for procreative management: Secondary | ICD-10-CM | POA: Diagnosis not present

## 2023-02-01 DIAGNOSIS — Z3481 Encounter for supervision of other normal pregnancy, first trimester: Secondary | ICD-10-CM | POA: Diagnosis not present

## 2023-02-01 DIAGNOSIS — O26841 Uterine size-date discrepancy, first trimester: Secondary | ICD-10-CM | POA: Diagnosis not present

## 2023-02-19 DIAGNOSIS — Z419 Encounter for procedure for purposes other than remedying health state, unspecified: Secondary | ICD-10-CM | POA: Diagnosis not present

## 2023-02-21 DIAGNOSIS — Z3482 Encounter for supervision of other normal pregnancy, second trimester: Secondary | ICD-10-CM | POA: Diagnosis not present

## 2023-02-21 DIAGNOSIS — Z369 Encounter for antenatal screening, unspecified: Secondary | ICD-10-CM | POA: Diagnosis not present

## 2023-02-21 DIAGNOSIS — Z8759 Personal history of other complications of pregnancy, childbirth and the puerperium: Secondary | ICD-10-CM | POA: Diagnosis not present

## 2023-03-22 DIAGNOSIS — Z419 Encounter for procedure for purposes other than remedying health state, unspecified: Secondary | ICD-10-CM | POA: Diagnosis not present

## 2023-04-04 DIAGNOSIS — Z363 Encounter for antenatal screening for malformations: Secondary | ICD-10-CM | POA: Diagnosis not present

## 2023-04-22 DIAGNOSIS — Z419 Encounter for procedure for purposes other than remedying health state, unspecified: Secondary | ICD-10-CM | POA: Diagnosis not present

## 2023-05-02 DIAGNOSIS — Z363 Encounter for antenatal screening for malformations: Secondary | ICD-10-CM | POA: Diagnosis not present

## 2023-05-02 DIAGNOSIS — Z362 Encounter for other antenatal screening follow-up: Secondary | ICD-10-CM | POA: Diagnosis not present

## 2023-05-16 DIAGNOSIS — Z3482 Encounter for supervision of other normal pregnancy, second trimester: Secondary | ICD-10-CM | POA: Diagnosis not present

## 2023-05-22 DIAGNOSIS — Z419 Encounter for procedure for purposes other than remedying health state, unspecified: Secondary | ICD-10-CM | POA: Diagnosis not present

## 2023-06-05 DIAGNOSIS — R7309 Other abnormal glucose: Secondary | ICD-10-CM | POA: Diagnosis not present

## 2023-06-13 DIAGNOSIS — O09293 Supervision of pregnancy with other poor reproductive or obstetric history, third trimester: Secondary | ICD-10-CM | POA: Diagnosis not present

## 2023-06-13 DIAGNOSIS — Z23 Encounter for immunization: Secondary | ICD-10-CM | POA: Diagnosis not present

## 2023-06-13 DIAGNOSIS — O09299 Supervision of pregnancy with other poor reproductive or obstetric history, unspecified trimester: Secondary | ICD-10-CM | POA: Diagnosis not present

## 2023-06-13 DIAGNOSIS — Z3A30 30 weeks gestation of pregnancy: Secondary | ICD-10-CM | POA: Diagnosis not present

## 2023-06-13 DIAGNOSIS — J4520 Mild intermittent asthma, uncomplicated: Secondary | ICD-10-CM | POA: Diagnosis not present

## 2023-06-22 DIAGNOSIS — Z419 Encounter for procedure for purposes other than remedying health state, unspecified: Secondary | ICD-10-CM | POA: Diagnosis not present

## 2023-07-22 DIAGNOSIS — Z419 Encounter for procedure for purposes other than remedying health state, unspecified: Secondary | ICD-10-CM | POA: Diagnosis not present

## 2023-07-25 DIAGNOSIS — Z3490 Encounter for supervision of normal pregnancy, unspecified, unspecified trimester: Secondary | ICD-10-CM | POA: Diagnosis not present

## 2023-08-20 DIAGNOSIS — Z3A40 40 weeks gestation of pregnancy: Secondary | ICD-10-CM | POA: Diagnosis not present

## 2023-08-20 DIAGNOSIS — Z87891 Personal history of nicotine dependence: Secondary | ICD-10-CM | POA: Diagnosis not present

## 2023-08-20 DIAGNOSIS — O99324 Drug use complicating childbirth: Secondary | ICD-10-CM | POA: Diagnosis not present

## 2023-08-20 DIAGNOSIS — O480 Post-term pregnancy: Secondary | ICD-10-CM | POA: Diagnosis not present

## 2023-08-20 DIAGNOSIS — F1290 Cannabis use, unspecified, uncomplicated: Secondary | ICD-10-CM | POA: Diagnosis not present

## 2023-08-20 DIAGNOSIS — J4520 Mild intermittent asthma, uncomplicated: Secondary | ICD-10-CM | POA: Diagnosis not present

## 2023-08-20 DIAGNOSIS — O2243 Hemorrhoids in pregnancy, third trimester: Secondary | ICD-10-CM | POA: Diagnosis not present

## 2023-08-20 DIAGNOSIS — O9952 Diseases of the respiratory system complicating childbirth: Secondary | ICD-10-CM | POA: Diagnosis not present

## 2023-08-20 DIAGNOSIS — Z7982 Long term (current) use of aspirin: Secondary | ICD-10-CM | POA: Diagnosis not present

## 2023-08-20 DIAGNOSIS — Z3483 Encounter for supervision of other normal pregnancy, third trimester: Secondary | ICD-10-CM | POA: Diagnosis not present

## 2023-08-21 DIAGNOSIS — Z3A40 40 weeks gestation of pregnancy: Secondary | ICD-10-CM | POA: Diagnosis not present

## 2023-08-22 DIAGNOSIS — Z419 Encounter for procedure for purposes other than remedying health state, unspecified: Secondary | ICD-10-CM | POA: Diagnosis not present

## 2023-09-21 DIAGNOSIS — Z3009 Encounter for other general counseling and advice on contraception: Secondary | ICD-10-CM | POA: Diagnosis not present

## 2023-09-21 DIAGNOSIS — Z30011 Encounter for initial prescription of contraceptive pills: Secondary | ICD-10-CM | POA: Diagnosis not present

## 2023-09-21 DIAGNOSIS — Z111 Encounter for screening for respiratory tuberculosis: Secondary | ICD-10-CM | POA: Diagnosis not present

## 2023-09-22 DIAGNOSIS — Z419 Encounter for procedure for purposes other than remedying health state, unspecified: Secondary | ICD-10-CM | POA: Diagnosis not present

## 2023-10-20 DIAGNOSIS — Z419 Encounter for procedure for purposes other than remedying health state, unspecified: Secondary | ICD-10-CM | POA: Diagnosis not present

## 2023-12-01 DIAGNOSIS — Z419 Encounter for procedure for purposes other than remedying health state, unspecified: Secondary | ICD-10-CM | POA: Diagnosis not present

## 2023-12-31 DIAGNOSIS — Z419 Encounter for procedure for purposes other than remedying health state, unspecified: Secondary | ICD-10-CM | POA: Diagnosis not present

## 2024-01-10 DIAGNOSIS — Z0000 Encounter for general adult medical examination without abnormal findings: Secondary | ICD-10-CM | POA: Diagnosis not present

## 2024-01-29 DIAGNOSIS — M25562 Pain in left knee: Secondary | ICD-10-CM | POA: Diagnosis not present

## 2024-01-29 DIAGNOSIS — M25561 Pain in right knee: Secondary | ICD-10-CM | POA: Diagnosis not present

## 2024-01-29 DIAGNOSIS — Z9181 History of falling: Secondary | ICD-10-CM | POA: Diagnosis not present

## 2024-01-31 DIAGNOSIS — Z419 Encounter for procedure for purposes other than remedying health state, unspecified: Secondary | ICD-10-CM | POA: Diagnosis not present

## 2024-03-01 DIAGNOSIS — Z419 Encounter for procedure for purposes other than remedying health state, unspecified: Secondary | ICD-10-CM | POA: Diagnosis not present

## 2024-04-01 DIAGNOSIS — Z419 Encounter for procedure for purposes other than remedying health state, unspecified: Secondary | ICD-10-CM | POA: Diagnosis not present

## 2024-05-02 DIAGNOSIS — Z419 Encounter for procedure for purposes other than remedying health state, unspecified: Secondary | ICD-10-CM | POA: Diagnosis not present
